# Patient Record
Sex: Female | Born: 1965 | Race: White | Hispanic: No | Marital: Married | Smoking: Never smoker
Health system: Southern US, Community
[De-identification: ages and names within clinical notes are randomized; demographics above are authoritative.]

## PROBLEM LIST (undated history)

## (undated) DIAGNOSIS — M549 Dorsalgia, unspecified: Secondary | ICD-10-CM

## (undated) DIAGNOSIS — R5383 Other fatigue: Secondary | ICD-10-CM

## (undated) DIAGNOSIS — N979 Female infertility, unspecified: Secondary | ICD-10-CM

## (undated) DIAGNOSIS — E039 Hypothyroidism, unspecified: Secondary | ICD-10-CM

## (undated) HISTORY — DX: Other fatigue: R53.83

## (undated) HISTORY — DX: Female infertility, unspecified: N97.9

## (undated) HISTORY — DX: Dorsalgia, unspecified: M54.9

## (undated) HISTORY — DX: Hypothyroidism, unspecified: E03.9

---

## 2004-08-17 ENCOUNTER — Other Ambulatory Visit: Admission: RE | Admit: 2004-08-17 | Discharge: 2004-08-17 | Payer: Self-pay | Admitting: Obstetrics and Gynecology

## 2010-11-17 ENCOUNTER — Encounter: Admission: RE | Admit: 2010-11-17 | Discharge: 2010-11-17 | Payer: Self-pay | Admitting: Family Medicine

## 2012-10-24 ENCOUNTER — Other Ambulatory Visit: Payer: Self-pay | Admitting: Family Medicine

## 2012-10-24 DIAGNOSIS — Z1231 Encounter for screening mammogram for malignant neoplasm of breast: Secondary | ICD-10-CM

## 2012-11-23 ENCOUNTER — Ambulatory Visit
Admission: RE | Admit: 2012-11-23 | Discharge: 2012-11-23 | Disposition: A | Discharge status: Home / Self Care | Payer: PRIVATE HEALTH INSURANCE | Source: Ambulatory Visit | Attending: Family Medicine | Admitting: Family Medicine

## 2012-11-23 DIAGNOSIS — Z1231 Encounter for screening mammogram for malignant neoplasm of breast: Secondary | ICD-10-CM

## 2013-11-19 ENCOUNTER — Other Ambulatory Visit: Payer: Self-pay

## 2013-11-19 DIAGNOSIS — Z1231 Encounter for screening mammogram for malignant neoplasm of breast: Secondary | ICD-10-CM

## 2013-12-28 ENCOUNTER — Ambulatory Visit
Admission: RE | Admit: 2013-12-28 | Discharge: 2013-12-28 | Disposition: A | Discharge status: Home / Self Care | Payer: Managed Care, Other (non HMO) | Source: Ambulatory Visit

## 2013-12-28 DIAGNOSIS — Z1231 Encounter for screening mammogram for malignant neoplasm of breast: Secondary | ICD-10-CM

## 2015-02-18 ENCOUNTER — Other Ambulatory Visit: Payer: Self-pay

## 2015-02-18 DIAGNOSIS — Z1231 Encounter for screening mammogram for malignant neoplasm of breast: Secondary | ICD-10-CM

## 2015-02-26 ENCOUNTER — Ambulatory Visit: Payer: Managed Care, Other (non HMO)

## 2015-03-12 ENCOUNTER — Ambulatory Visit
Admission: RE | Admit: 2015-03-12 | Discharge: 2015-03-12 | Disposition: A | Discharge status: Home / Self Care | Payer: 59 | Source: Ambulatory Visit

## 2015-03-12 DIAGNOSIS — Z1231 Encounter for screening mammogram for malignant neoplasm of breast: Secondary | ICD-10-CM

## 2016-04-29 ENCOUNTER — Other Ambulatory Visit: Payer: Self-pay

## 2016-04-29 DIAGNOSIS — Z1231 Encounter for screening mammogram for malignant neoplasm of breast: Secondary | ICD-10-CM

## 2016-05-13 ENCOUNTER — Ambulatory Visit
Admission: RE | Admit: 2016-05-13 | Discharge: 2016-05-13 | Disposition: A | Discharge status: Home / Self Care | Payer: 59 | Source: Ambulatory Visit

## 2016-05-13 DIAGNOSIS — Z1231 Encounter for screening mammogram for malignant neoplasm of breast: Secondary | ICD-10-CM

## 2017-09-15 ENCOUNTER — Other Ambulatory Visit: Payer: Self-pay | Admitting: Family Medicine

## 2017-09-15 DIAGNOSIS — Z1231 Encounter for screening mammogram for malignant neoplasm of breast: Secondary | ICD-10-CM

## 2017-09-28 ENCOUNTER — Ambulatory Visit: Payer: 59

## 2017-10-10 ENCOUNTER — Ambulatory Visit
Admission: RE | Admit: 2017-10-10 | Discharge: 2017-10-10 | Disposition: A | Discharge status: Home / Self Care | Payer: BC Managed Care – PPO | Source: Ambulatory Visit | Attending: Family Medicine | Admitting: Family Medicine

## 2017-10-10 DIAGNOSIS — Z1231 Encounter for screening mammogram for malignant neoplasm of breast: Secondary | ICD-10-CM

## 2017-12-08 ENCOUNTER — Encounter (INDEPENDENT_AMBULATORY_CARE_PROVIDER_SITE_OTHER): Payer: Self-pay

## 2017-12-21 ENCOUNTER — Encounter (INDEPENDENT_AMBULATORY_CARE_PROVIDER_SITE_OTHER): Payer: Self-pay | Admitting: Family Medicine

## 2017-12-21 ENCOUNTER — Ambulatory Visit (INDEPENDENT_AMBULATORY_CARE_PROVIDER_SITE_OTHER): Payer: BC Managed Care – PPO | Admitting: Family Medicine

## 2017-12-21 VITALS — BP 134/90 | HR 71 | Temp 97.6°F | Ht 69.0 in | Wt 273.0 lb

## 2017-12-21 DIAGNOSIS — E66813 Obesity, class 3: Secondary | ICD-10-CM

## 2017-12-21 DIAGNOSIS — Z1331 Encounter for screening for depression: Secondary | ICD-10-CM | POA: Diagnosis not present

## 2017-12-21 DIAGNOSIS — E063 Autoimmune thyroiditis: Secondary | ICD-10-CM

## 2017-12-21 DIAGNOSIS — R5383 Other fatigue: Secondary | ICD-10-CM

## 2017-12-21 DIAGNOSIS — Z0289 Encounter for other administrative examinations: Secondary | ICD-10-CM

## 2017-12-21 DIAGNOSIS — Z6841 Body Mass Index (BMI) 40.0 and over, adult: Secondary | ICD-10-CM

## 2017-12-21 DIAGNOSIS — R0602 Shortness of breath: Secondary | ICD-10-CM | POA: Diagnosis not present

## 2017-12-21 NOTE — Progress Notes (Signed)
.  Office: [phone]  /  Fax: [phone]   HPI:   Chief Complaint: OBESITY  Christy [name] (MR# [id number]) is a 52 y.o. female who presents on 12/21/2017 for obesity evaluation and treatment. Current BMI is Body mass index is 40.32 kg/m.Marland Kitchen Christy [name] has struggled with obesity for years and has been unsuccessful in either losing weight or maintaining long term weight loss. Christy [name] attended our information session and states she is currently in the action stage of change and ready to dedicate time achieving and maintaining a healthier weight. Christy [name] notes emotional eating and she is tearful in the office while discussing her weight.   Christy [name] states her family eats meals together she thinks her family will eat healthier with  her she struggles with family and or coworkers weight loss sabotage her desired weight loss is 53-78 lbs she has been heavy most of  her life she started gaining weight the last several years her heaviest weight ever was 273 lbs. she has significant food cravings issues  she snacks frequently in the evenings she skips meals frequently she is frequently drinking liquids with calories she frequently makes poor food choices she has problems with excessive hunger  she frequently eats larger portions than normal  she has binge eating behaviors she struggles with emotional eating    Christy [name] feels her energy is lower than it should be. This has worsened with weight gain and has not worsened recently. Christy [name] admits to daytime somnolence and admits she doesn't sleep well and wakes up unrefreshed. Patient is at risk for obstructive sleep apnea. Patent has a history of symptoms of daytime Christy. Patient generally gets 6 hours of sleep per night, and states they generally have nightime awakenings. Snoring is present. Apneic episodes are present. Epworth Sleepiness Score is 16.  Dyspnea on exertion Brinlyn notes increasing shortness of breath with exercising and seems to be  worsening over time with weight gain. She notes getting out of breath sooner with activity than she used to. This has not gotten worse recently. Christy [name] denies orthopnea.  Hashimoto's Hypothyroidism Madhavi has a diagnosis of hashimoto's hypothyroidism. She is on Synthroid and she has no recent labs in [facility]. She notes Christy denies hot or cold intolerance or palpitations.  Depression Screen Christy [name]'s Food and Mood (modified PHQ-9) score was  Depression screen PHQ 2/9 12/21/2017  Decreased Interest 1  Down, Depressed, Hopeless 1  PHQ - 2 Score 2  Altered sleeping 1  Tired, decreased energy 3  Change in appetite 2  Feeling bad or failure about yourself  2  Trouble concentrating 1  Moving slowly or fidgety/restless 1  Suicidal thoughts 0  PHQ-9 Score 12  Difficult doing work/chores Not difficult at all   ALLERGIES: Not on File  MEDICATIONS: Current Outpatient Medications on File Prior to Visit  Medication Sig Dispense Refill  . levothyroxine (SYNTHROID, LEVOTHROID) 150 MCG tablet Take 150 mcg by mouth daily before breakfast.     No current facility-administered medications on file prior to visit.     PAST MEDICAL HISTORY: Past Medical History:  Diagnosis Date  . Back pain   . Christy   . Hypothyroidism   . Infertility, female     PAST SURGICAL HISTORY: No past surgical history on file.  SOCIAL HISTORY: Social History   Tobacco Use  . Smoking status: Not on file  Substance Use Topics  . Alcohol use: No    Frequency: Never  . Drug use: No    FAMILY HISTORY: Family  History  Problem Relation Age of Onset  . Diabetes Mother   . Obesity Mother   . Cancer Father     ROS: Review of Systems  Constitutional: Positive for malaise/Christy. Negative for weight loss.       Negative hot/cold intolerance  HENT: Positive for hearing loss (decreased hearing).   Eyes:       Wears glasses or contacts (distance)  Respiratory: Positive for shortness of breath (with exertion).     Cardiovascular: Negative for palpitations and orthopnea.  Musculoskeletal: Positive for back pain.  Skin:       Dryness   Endo/Heme/Allergies:       Excessive hunger  Psychiatric/Behavioral: Positive for depression. Negative for suicidal ideas.       Stress     PHYSICAL EXAM: Blood pressure 134/90, pulse 71, temperature 97.6 F (36.4 C), temperature source Oral, height 5\' 9"  (1.753 m), weight 273 lb (123.8 kg), last menstrual period 10/04/2017, SpO2 98 %. Body mass index is 40.32 kg/m. Physical Exam  Constitutional: She is oriented to person, place, and time. She appears well-developed and well-nourished.  Cardiovascular: Normal rate.  Pulmonary/Chest: Effort normal.  Musculoskeletal: Normal range of motion.  Neurological: She is oriented to person, place, and time.  Skin: Skin is warm and dry.  Psychiatric: She has a normal mood and affect.  Vitals reviewed.   RECENT LABS AND TESTS: BMET No results found for: NA, K, CL, CO2, GLUCOSE, BUN, CREATININE, CALCIUM, GFRNONAA, GFRAA No results found for: HGBA1C No results found for: INSULIN CBC No results found for: WBC, RBC, HGB, HCT, PLT, MCV, MCH, MCHC, RDW, LYMPHSABS, MONOABS, EOSABS, BASOSABS Iron/TIBC/Ferritin/ %Sat No results found for: IRON, TIBC, FERRITIN, IRONPCTSAT Lipid Panel  No results found for: CHOL, TRIG, HDL, CHOLHDL, VLDL, LDLCALC, LDLDIRECT Hepatic Function Panel  No results found for: PROT, ALBUMIN, AST, ALT, ALKPHOS, BILITOT, BILIDIR, IBILI No results found for: TSH  ECG  shows NSR with a rate of 73 BPM INDIRECT CALORIMETER done today shows a VO2 of 225 and a REE of 1566. Her calculated basal metabolic rate is 4098 thus her basal metabolic rate is worse than expected.    ASSESSMENT AND PLAN: Other Christy - Plan: EKG 12-Lead, Comprehensive metabolic panel, CBC With Differential, Hemoglobin A1c, Insulin, random, Lipid Panel With LDL/HDL Ratio, VITAMIN D 25 Hydroxy (Vit-D Deficiency, Fractures), Vitamin  B12, Folate  Shortness of breath on exertion - Plan: CBC With Differential, Hemoglobin A1c, Insulin, random, Lipid Panel With LDL/HDL Ratio  Hashimoto's disease - Plan: T3, T4, free, TSH  Depression screening  Class 3 severe obesity with serious comorbidity and body mass index (BMI) of 40.0 to 44.9 in adult, unspecified obesity type (HCC)  PLAN:  Christy Christy [name] was informed that her Christy may be related to obesity, depression or many other causes. Labs will be ordered, and in the meanwhile Christy [name] has agreed to work on diet, exercise and weight loss to help with Christy. Proper sleep hygiene was discussed including the need for 7-8 hours of quality sleep each night. A sleep study was not ordered based on symptoms and Epworth score. Meyah may need a sleep study if Christy doesn't improve with weight loss.  Dyspnea on exertion Christy [name]'s shortness of breath appears to be obesity related and exercise induced. She has agreed to work on weight loss and gradually increase exercise to treat her exercise induced shortness of breath. If Christy [name] follows our instructions and loses weight without improvement of her shortness of breath, we will plan to refer  to pulmonology. We will monitor this condition regularly. Christy [name] agrees to this plan.  Hashimoto's Hypothyroidism Christy [name] was informed of the importance of good thyroid control to help with weight loss efforts. She was also informed that supertheraputic thyroid levels are dangerous and will not improve weight loss results. We will check labs and Christy [name] will continue taking Synthroid as prescribed.   Depression Screen Christy [name] had a moderately positive depression screening. Depression is commonly associated with obesity and often results in emotional eating behaviors. We will monitor this closely and work on CBT to help improve the non-hunger eating patterns. Referral to Psychology may be required if no improvement is seen as she continues in our  clinic.  Obesity Christy [name] is currently in the action stage of change and her goal is to continue with weight loss efforts She has agreed to follow the Category 2 plan + 100 calories Christy [name] has been instructed to work up to a goal of 150 minutes of combined cardio and strengthening exercise per week for weight loss and overall health benefits. We discussed the following Behavioral Modification Strategies today: increasing lean protein intake, decrease eating out, work on meal planning and easy cooking plans and dealing with family or coworker sabotage  Christy [name] has agreed to follow up with our clinic in 2 weeks. She was informed of the importance of frequent follow up visits to maximize her success with intensive lifestyle modifications for her multiple health conditions. She was informed we would discuss her lab results at her next visit unless there is a critical issue that needs to be addressed sooner. Christy [name] agreed to keep her next visit at the agreed upon time to discuss these results.  Trude McburneyI, Sharon Martin, am acting as transcriptionist for Quillian Quincearen Kayshawn Ozburn, MD   Today's visit was # 1 out of 22.  Starting weight: 273 lbs Starting date: 12/21/17 Today's weight : 273 lbs  Today's date: 12/21/2017 Total lbs lost to date: 0 (Patients must lose 7 lbs in the first 6 months to continue with counseling)   ASK: We discussed the diagnosis of obesity with Melburn HakeLaura Klee today and Christy [name] agreed to give us permission to discuss obesity behavioral modification therapy today.  ASSESS: Christy [name] has the diagnosis of obesity and her BMI today is 40.3 Christy [name] is in the action stage of change   ADVISE: Christy [name] was educated on the multiple health risks of obesity as well as the benefit of weight loss to improve her health. She was advised of the need for long term treatment and the importance of lifestyle modifications.  AGREE: Multiple dietary modification options and treatment options were discussed and  Christy [name] agreed to  follow the Category 2 plan + 100 calories We discussed the following Behavioral Modification Strategies today: increasing lean protein intake, decrease eating out, work on meal planning and easy cooking plans and dealing with family or coworker sabotage  I have reviewed the above documentation for accuracy and completeness, and I agree with the above. -Quillian Quincearen Lavenia Stumpo, MD

## 2017-12-22 LAB — COMPREHENSIVE METABOLIC PANEL
A/G RATIO: 2 (ref 1.2–2.2)
ALBUMIN: 4.3 g/dL (ref 3.5–5.5)
ALT: 14 IU/L (ref 0–32)
AST: 25 IU/L (ref 0–40)
Alkaline Phosphatase: 80 IU/L (ref 39–117)
BUN / CREAT RATIO: 20 (ref 9–23)
BUN: 14 mg/dL (ref 6–24)
Bilirubin Total: 0.3 mg/dL (ref 0.0–1.2)
CALCIUM: 9 mg/dL (ref 8.7–10.2)
CO2: 20 mmol/L (ref 20–29)
Chloride: 102 mmol/L (ref 96–106)
Creatinine, Ser: 0.71 mg/dL (ref 0.57–1.00)
GFR, EST AFRICAN AMERICAN: 113 mL/min/{1.73_m2} (ref >59)
GFR, EST NON AFRICAN AMERICAN: 98 mL/min/{1.73_m2} (ref >59)
GLOBULIN, TOTAL: 2.2 g/dL (ref 1.5–4.5)
Glucose: 97 mg/dL (ref 65–99)
POTASSIUM: 4.6 mmol/L (ref 3.5–5.2)
SODIUM: 138 mmol/L (ref 134–144)
TOTAL PROTEIN: 6.5 g/dL (ref 6.0–8.5)

## 2017-12-22 LAB — CBC WITH DIFFERENTIAL
BASOS: 1 %
Basophils Absolute: 0 10*3/uL (ref 0.0–0.2)
EOS (ABSOLUTE): 0.3 10*3/uL (ref 0.0–0.4)
Eos: 5 %
Hematocrit: 39.8 % (ref 34.0–46.6)
Hemoglobin: 12.9 g/dL (ref 11.1–15.9)
Immature Grans (Abs): 0 10*3/uL (ref 0.0–0.1)
Immature Granulocytes: 0 %
LYMPHS ABS: 1.9 10*3/uL (ref 0.7–3.1)
Lymphs: 30 %
MCH: 29.3 pg (ref 26.6–33.0)
MCHC: 32.4 g/dL (ref 31.5–35.7)
MCV: 90 fL (ref 79–97)
Monocytes Absolute: 0.4 10*3/uL (ref 0.1–0.9)
Monocytes: 7 %
NEUTROS ABS: 3.8 10*3/uL (ref 1.4–7.0)
NEUTROS PCT: 57 %
RBC: 4.41 x10E6/uL (ref 3.77–5.28)
RDW: 14.7 % (ref 12.3–15.4)
WBC: 6.5 10*3/uL (ref 3.4–10.8)

## 2017-12-22 LAB — LIPID PANEL WITH LDL/HDL RATIO
Cholesterol, Total: 252 mg/dL — ABNORMAL HIGH (ref 100–199)
HDL: 47 mg/dL (ref >39)
LDL CALC: 173 mg/dL — AB (ref 0–99)
LDL/HDL RATIO: 3.7 ratio — AB (ref 0.0–3.2)
TRIGLYCERIDES: 159 mg/dL — AB (ref 0–149)
VLDL CHOLESTEROL CAL: 32 mg/dL (ref 5–40)

## 2017-12-22 LAB — T4, FREE: Free T4: 1.62 ng/dL (ref 0.82–1.77)

## 2017-12-22 LAB — FOLATE: FOLATE: 5 ng/mL (ref >3.0)

## 2017-12-22 LAB — VITAMIN D 25 HYDROXY (VIT D DEFICIENCY, FRACTURES): Vit D, 25-Hydroxy: 18.6 ng/mL — ABNORMAL LOW (ref 30.0–100.0)

## 2017-12-22 LAB — TSH: TSH: 0.981 u[IU]/mL (ref 0.450–4.500)

## 2017-12-22 LAB — HEMOGLOBIN A1C
Est. average glucose Bld gHb Est-mCnc: 117 mg/dL
Hgb A1c MFr Bld: 5.7 % — ABNORMAL HIGH (ref 4.8–5.6)

## 2017-12-22 LAB — T3: T3, Total: 94 ng/dL (ref 71–180)

## 2017-12-22 LAB — VITAMIN B12: Vitamin B-12: 514 pg/mL (ref 232–1245)

## 2017-12-22 LAB — INSULIN, RANDOM: INSULIN: 14.4 u[IU]/mL (ref 2.6–24.9)

## 2018-01-04 ENCOUNTER — Ambulatory Visit (INDEPENDENT_AMBULATORY_CARE_PROVIDER_SITE_OTHER): Payer: BC Managed Care – PPO | Admitting: Family Medicine

## 2018-01-04 VITALS — BP 127/86 | HR 77 | Temp 98.0°F | Ht 69.0 in | Wt 265.0 lb

## 2018-01-04 DIAGNOSIS — R7303 Prediabetes: Secondary | ICD-10-CM | POA: Insufficient documentation

## 2018-01-04 DIAGNOSIS — Z6839 Body mass index (BMI) 39.0-39.9, adult: Secondary | ICD-10-CM

## 2018-01-04 DIAGNOSIS — E559 Vitamin D deficiency, unspecified: Secondary | ICD-10-CM | POA: Diagnosis not present

## 2018-01-04 DIAGNOSIS — E782 Mixed hyperlipidemia: Secondary | ICD-10-CM

## 2018-01-04 DIAGNOSIS — Z9189 Other specified personal risk factors, not elsewhere classified: Secondary | ICD-10-CM

## 2018-01-04 MED ORDER — VITAMIN D (ERGOCALCIFEROL) 1.25 MG (50000 UNIT) PO CAPS: 50000.0000 [IU] | ORAL_CAPSULE | ORAL | 0 refills | Status: DC

## 2018-01-04 MED ORDER — METFORMIN HCL 500 MG PO TABS: 500.0000 mg | ORAL_TABLET | Freq: Every day | ORAL | 0 refills | Status: DC

## 2018-01-04 NOTE — Progress Notes (Signed)
Office: [phone]  /  Fax: [phone]   HPI:   Chief Complaint: OBESITY Christy [name] is here to discuss her progress with her obesity treatment plan. She is on the Category 2 plan +100 calories and is following her eating plan approximately 90 % of the time. She states she is exercising 0 minutes 0 times per week. Christy [name] did very well with weight loss over the last 2 weeks. She had some family sabotage, especially for dinner. She missed eating potatoes and pasta. Hunger was controlled. Her weight is 265 lb (120.2 kg) today and has had a weight loss of 8 pounds over a period of 2 weeks since her last visit. She has lost 8 lbs since starting treatment with Christy [name].  Mixed Hyperlipidemia [name] has a new diagnosis of mixed hyperlipidemia and is not on statin. She would like to attempt to improve her cholesterol levels with intensive lifestyle modification including a low saturated fat diet, exercise and weight loss. She denies any chest pain, claudication or myalgias.  Vitamin D deficiency Christy [name] has a new diagnosis of vitamin D deficiency. She is not currently taking vit D and admits fatigue but denies nausea, vomiting or muscle weakness.   Ref. Range 12/21/2017 10:57  Vitamin D, 25-Hydroxy Latest Ref Range: 30.0 - 100.0 ng/mL 18.6 (L)   Pre-Diabetes Christy [name] has a diagnosis of pre-diabetes based on her elevated Hgb A1c and fasting insulin. She was informed this puts her at greater risk of developing diabetes. She is not taking metformin currently and continues to work on diet and exercise to decrease risk of diabetes. She admits polyphagia, but this improved on diet prescription. [name] denies nausea or hypoglycemia.   ALLERGIES: Not on File  MEDICATIONS: Current Outpatient Medications on File Prior to Visit  Medication Sig Dispense Refill  . levothyroxine (SYNTHROID, LEVOTHROID) 150 MCG tablet Take 150 mcg by mouth daily before breakfast.     No current facility-administered medications on file  prior to visit.     PAST MEDICAL HISTORY: Past Medical History:  Diagnosis Date  . Back pain   . Fatigue   . Hypothyroidism   . Infertility, female     PAST SURGICAL HISTORY: No past surgical history on file.  SOCIAL HISTORY: Social History   Tobacco Use  . Smoking status: Not on file  Substance Use Topics  . Alcohol use: No    Frequency: Never  . Drug use: No    FAMILY HISTORY: Family History  Problem Relation Age of Onset  . Diabetes Mother   . Obesity Mother   . Cancer Father     ROS: Review of Systems  Constitutional: Positive for malaise/fatigue and weight loss.  Cardiovascular: Negative for chest pain and claudication.  Gastrointestinal: Negative for nausea and vomiting.  Musculoskeletal: Negative for myalgias.       Negative muscle weakness  Endo/Heme/Allergies:       Positive polyphagia Negative hypoglycemia    PHYSICAL EXAM: Blood pressure 127/86, pulse 77, temperature 98 F (36.7 C), temperature source Oral, height 5\' 9"  (1.753 m), weight 265 lb (120.2 kg), SpO2 96 %. Body mass index is 39.13 kg/m. Physical Exam  Constitutional: She is oriented to person, place, and time. She appears well-developed and well-nourished.  Cardiovascular: Normal rate.  Pulmonary/Chest: Effort normal.  Musculoskeletal: Normal range of motion.  Neurological: She is oriented to person, place, and time.  Skin: Skin is warm and dry.  Psychiatric: She has a normal mood and affect. Her behavior is normal.  Vitals reviewed.  RECENT LABS AND TESTS: BMET    Component Value Date/Time   NA 138 12/21/2017 1057   K 4.6 12/21/2017 1057   CL 102 12/21/2017 1057   CO2 20 12/21/2017 1057   GLUCOSE 97 12/21/2017 1057   BUN 14 12/21/2017 1057   CREATININE 0.71 12/21/2017 1057   CALCIUM 9.0 12/21/2017 1057   GFRNONAA 98 12/21/2017 1057   GFRAA 113 12/21/2017 1057   Lab Results  Component Value Date   HGBA1C 5.7 (H) 12/21/2017   Lab Results  Component Value Date    INSULIN 14.4 12/21/2017   CBC    Component Value Date/Time   WBC 6.5 12/21/2017 1057   RBC 4.41 12/21/2017 1057   HGB 12.9 12/21/2017 1057   HCT 39.8 12/21/2017 1057   MCV 90 12/21/2017 1057   MCH 29.3 12/21/2017 1057   MCHC 32.4 12/21/2017 1057   RDW 14.7 12/21/2017 1057   LYMPHSABS 1.9 12/21/2017 1057   EOSABS 0.3 12/21/2017 1057   BASOSABS 0.0 12/21/2017 1057   Iron/TIBC/Ferritin/ %Sat No results found for: IRON, TIBC, FERRITIN, IRONPCTSAT Lipid Panel     Component Value Date/Time   CHOL 252 (H) 12/21/2017 1057   TRIG 159 (H) 12/21/2017 1057   HDL 47 12/21/2017 1057   LDLCALC 173 (H) 12/21/2017 1057   Hepatic Function Panel     Component Value Date/Time   PROT 6.5 12/21/2017 1057   ALBUMIN 4.3 12/21/2017 1057   AST 25 12/21/2017 1057   ALT 14 12/21/2017 1057   ALKPHOS 80 12/21/2017 1057   BILITOT 0.3 12/21/2017 1057      Component Value Date/Time   TSH 0.981 12/21/2017 1057     Ref. Range 12/21/2017 10:57  Vitamin D, 25-Hydroxy Latest Ref Range: 30.0 - 100.0 ng/mL 18.6 (L)    ASSESSMENT AND PLAN: Mixed hyperlipidemia  Vitamin D deficiency - Plan: Vitamin D, Ergocalciferol, (DRISDOL) 50000 units CAPS capsule  Prediabetes - Plan: metFORMIN (GLUCOPHAGE) 500 MG tablet  At risk for diabetes mellitus  Class 2 severe obesity with serious comorbidity and body mass index (BMI) of 39.0 to 39.9 in adult, unspecified obesity type (HCC)  PLAN:  Mixed Hyperlipidemia Christy [name] was informed of the American Heart Association Guidelines emphasizing intensive lifestyle modifications as the first line treatment for hyperlipidemia. We discussed many lifestyle modifications today in depth, and Christy [name] will continue to work on decreasing saturated fats such as fatty red meat, butter and many fried foods. She will also increase vegetables and lean protein in her diet and continue to work on exercise and weight loss efforts. We will recheck labs in 3 months and Christy [name] agrees to follow up  at the agreed upon time.  Vitamin D Deficiency Christy [name] was informed that low vitamin D levels contributes to fatigue and are associated with obesity, breast, and colon cancer. She agrees to start to take prescription Vit D @50 ,000 IU every week #4 with no refills and will follow up for routine testing of vitamin D, at least 2-3 times per year. She was informed of the risk of over-replacement of vitamin D and agrees to not increase her dose unless she discusses this with us first. Christy [name] agrees to follow up with our clinic in 2 weeks.  Pre-Diabetes Christy [name] will continue to work on weight loss, exercise, and decreasing simple carbohydrates in her diet to help decrease the risk of diabetes. We dicussed metformin including benefits and risks. She was informed that eating too many simple carbohydrates or too many calories at one sitting increases  the likelihood of GI side effects. Mesa agrees to start metformin 500 mg qam #30 with no refills. We will recheck labs in 3 months and  Karon agreed to follow up with Christy [name] as directed to monitor her progress.  Obesity Megen is currently in the action stage of change. As such, her goal is to continue with weight loss efforts She has agreed to follow the Category 2 plan Makensey has been instructed to work up to a goal of 150 minutes of combined cardio and strengthening exercise per week for weight loss and overall health benefits. We discussed the following Behavioral Modification Strategies today: increasing lean protein intake, decreasing simple carbohydrates , work on meal planning and easy cooking plans and dealing with family or coworker sabotage  Lakiya has agreed to follow up with our clinic in 2 weeks. She was informed of the importance of frequent follow up visits to maximize her success with intensive lifestyle modifications for her multiple health conditions.   OBESITY BEHAVIORAL INTERVENTION VISIT  Today's visit was # 2 out of 22.  Starting weight: 273  lbs Starting date: 12/21/17 Today's weight : 265 lbs Today's date: 01/04/2018 Total lbs lost to date: 8 (Patients must lose 7 lbs in the first 6 months to continue with counseling)   ASK: We discussed the diagnosis of obesity with Melburn Hake today and Lelar agreed to give Christy [name] permission to discuss obesity behavioral modification therapy today.  ASSESS: Kinzy has the diagnosis of obesity and her BMI today is 39.12 Tami is in the action stage of change   ADVISE: Mackenzi was educated on the multiple health risks of obesity as well as the benefit of weight loss to improve her health. She was advised of the need for long term treatment and the importance of lifestyle modifications.  AGREE: Multiple dietary modification options and treatment options were discussed and  Xochilt agreed to the above obesity treatment plan.  I, [place] Crane, am acting as transcriptionist for Quillian Quince, MD  I have reviewed the above documentation for accuracy and completeness, and I agree with the above. -Quillian Quince, MD

## 2018-01-18 ENCOUNTER — Ambulatory Visit (INDEPENDENT_AMBULATORY_CARE_PROVIDER_SITE_OTHER): Payer: BC Managed Care – PPO | Admitting: Family Medicine

## 2018-01-18 VITALS — BP 127/77 | HR 74 | Temp 97.7°F | Ht 69.0 in | Wt 261.0 lb

## 2018-01-18 DIAGNOSIS — Z6838 Body mass index (BMI) 38.0-38.9, adult: Secondary | ICD-10-CM | POA: Diagnosis not present

## 2018-01-18 DIAGNOSIS — R7303 Prediabetes: Secondary | ICD-10-CM | POA: Diagnosis not present

## 2018-01-18 NOTE — Progress Notes (Signed)
Office: [phone]  /  Fax: [phone]   HPI:   Chief Complaint: OBESITY Christy [name] is here to discuss her progress with her obesity treatment plan. She is on the Category 2 plan and is following her eating plan approximately 80 % of the time. She states she is exercising 0 minutes 0 times per week. Christy [name] continues to do well with weight loss on the category 2 plan. She had some increased celebration eating and eating out, but she tried to make good choices. Her weight is 261 lb (118.4 kg) today and has had a weight loss of 4 pounds over a period of 2 weeks since her last visit. She has lost 12 lbs since starting treatment with us.  Pre-Diabetes Christy [name] has a diagnosis of pre-diabetes based on her elevated Hgb A1c and was informed this puts her at greater risk of developing diabetes. She started taking metformin and noted mildly loose stools, but this has improved. She is doing well on her diet prescription and with weight loss. Christy [name] continues to work on diet and exercise to decrease risk of diabetes. She denies nausea or hypoglycemia.  ALLERGIES: Not on File  MEDICATIONS: Current Outpatient Medications on File Prior to Visit  Medication Sig Dispense Refill  . levothyroxine (SYNTHROID, LEVOTHROID) 150 MCG tablet Take 150 mcg by mouth daily before breakfast.    . metFORMIN (GLUCOPHAGE) 500 MG tablet Take 1 tablet (500 mg total) by mouth daily with breakfast. 30 tablet 0  . Vitamin D, Ergocalciferol, (DRISDOL) 50000 units CAPS capsule Take 1 capsule (50,000 Units total) by mouth every 7 (seven) days. 4 capsule 0   No current facility-administered medications on file prior to visit.     PAST MEDICAL HISTORY: Past Medical History:  Diagnosis Date  . Back pain   . Fatigue   . Hypothyroidism   . Infertility, female     PAST SURGICAL HISTORY: No past surgical history on file.  SOCIAL HISTORY: Social History   Tobacco Use  . Smoking status: Not on file  Substance Use Topics  .  Alcohol use: No    Frequency: Never  . Drug use: No    FAMILY HISTORY: Family History  Problem Relation Age of Onset  . Diabetes Mother   . Obesity Mother   . Cancer Father     ROS: Review of Systems  Constitutional: Positive for weight loss.  Gastrointestinal: Positive for diarrhea. Negative for nausea.  Endo/Heme/Allergies:       Negative hypoglycemia    PHYSICAL EXAM: Blood pressure 127/77, pulse 74, temperature 97.7 F (36.5 C), temperature source Oral, height 5\' 9"  (1.753 m), weight 261 lb (118.4 kg), SpO2 97 %. Body mass index is 38.54 kg/m. Physical Exam  Constitutional: She is oriented to person, place, and time. She appears well-developed and well-nourished.  Cardiovascular: Normal rate.  Pulmonary/Chest: Effort normal.  Musculoskeletal: Normal range of motion.  Neurological: She is oriented to person, place, and time.  Skin: Skin is warm and dry.  Psychiatric: She has a normal mood and affect. Her behavior is normal.  Vitals reviewed.   RECENT LABS AND TESTS: BMET    Component Value Date/Time   NA 138 12/21/2017 1057   K 4.6 12/21/2017 1057   CL 102 12/21/2017 1057   CO2 20 12/21/2017 1057   GLUCOSE 97 12/21/2017 1057   BUN 14 12/21/2017 1057   CREATININE 0.71 12/21/2017 1057   CALCIUM 9.0 12/21/2017 1057   GFRNONAA 98 12/21/2017 1057   GFRAA 113 12/21/2017 1057  Lab Results  Component Value Date   HGBA1C 5.7 (H) 12/21/2017   Lab Results  Component Value Date   INSULIN 14.4 12/21/2017   CBC    Component Value Date/Time   WBC 6.5 12/21/2017 1057   RBC 4.41 12/21/2017 1057   HGB 12.9 12/21/2017 1057   HCT 39.8 12/21/2017 1057   MCV 90 12/21/2017 1057   MCH 29.3 12/21/2017 1057   MCHC 32.4 12/21/2017 1057   RDW 14.7 12/21/2017 1057   LYMPHSABS 1.9 12/21/2017 1057   EOSABS 0.3 12/21/2017 1057   BASOSABS 0.0 12/21/2017 1057   Iron/TIBC/Ferritin/ %Sat No results found for: IRON, TIBC, FERRITIN, IRONPCTSAT Lipid Panel     Component  Value Date/Time   CHOL 252 (H) 12/21/2017 1057   TRIG 159 (H) 12/21/2017 1057   HDL 47 12/21/2017 1057   LDLCALC 173 (H) 12/21/2017 1057   Hepatic Function Panel     Component Value Date/Time   PROT 6.5 12/21/2017 1057   ALBUMIN 4.3 12/21/2017 1057   AST 25 12/21/2017 1057   ALT 14 12/21/2017 1057   ALKPHOS 80 12/21/2017 1057   BILITOT 0.3 12/21/2017 1057      Component Value Date/Time   TSH 0.981 12/21/2017 1057    ASSESSMENT AND PLAN: Prediabetes  Class 2 severe obesity with serious comorbidity and body mass index (BMI) of 38.0 to 38.9 in adult, unspecified obesity type (HCC)  PLAN:  Pre-Diabetes Christy [name] will continue to work on weight loss, exercise, and decreasing simple carbohydrates in her diet to help decrease the risk of diabetes. We dicussed metformin including benefits and risks. She was informed that eating too many simple carbohydrates or too many calories at one sitting increases the likelihood of GI side effects. Cing agrees to continue metformin (no refill needed) and follow up with [name] as directed to monitor her progress.  We spent > than 50% of the 15 minute visit on the counseling as documented in the note.  Obesity Christy [name] is currently in the action stage of change. As such, her goal is to continue with weight loss efforts She has agreed to follow the Category 2 plan Christy [name] has been instructed to work up to a goal of 150 minutes of combined cardio and strengthening exercise per week for weight loss and overall health benefits. We discussed the following Behavioral Modification Strategies today: increasing lean protein intake, decreasing simple carbohydrates and celebration eating strategies   Christy [name] has agreed to follow up with our clinic in 2 weeks. She was informed of the importance of frequent follow up visits to maximize her success with intensive lifestyle modifications for her multiple health conditions.   OBESITY BEHAVIORAL INTERVENTION VISIT  Today's  visit was # 3 out of 22.  Starting weight: 273 lbs Starting date: 12/21/17 Today's weight : 261 lbs Today's date: 01/18/2018 Total lbs lost to date: 12 (Patients must lose 7 lbs in the first 6 months to continue with counseling)   ASK: We discussed the diagnosis of obesity with Christy [name] today and Christy [name] agreed to give [name] permission to discuss obesity behavioral modification therapy today.  ASSESS: Christy [name] has the diagnosis of obesity and her BMI today is 38.53 Christy [name] is in the action stage of change   ADVISE: Jonell was educated on the multiple health risks of obesity as well as the benefit of weight loss to improve her health. She was advised of the need for long term treatment and the importance of lifestyle modifications.  AGREE: Multiple dietary modification options  and treatment options were discussed and  Aliene agreed to the above obesity treatment plan.  I, Nevada Crane, am acting as transcriptionist for Quillian Quince, MD  I have reviewed the above documentation for accuracy and completeness, and I agree with the above. -Quillian Quince, MD

## 2018-01-31 ENCOUNTER — Other Ambulatory Visit (INDEPENDENT_AMBULATORY_CARE_PROVIDER_SITE_OTHER): Payer: Self-pay | Admitting: Family Medicine

## 2018-01-31 DIAGNOSIS — R7303 Prediabetes: Secondary | ICD-10-CM

## 2018-02-02 ENCOUNTER — Ambulatory Visit (INDEPENDENT_AMBULATORY_CARE_PROVIDER_SITE_OTHER): Payer: BC Managed Care – PPO | Admitting: Family Medicine

## 2018-02-02 VITALS — BP 124/86 | HR 71 | Temp 97.9°F | Ht 69.0 in | Wt 259.0 lb

## 2018-02-02 DIAGNOSIS — E559 Vitamin D deficiency, unspecified: Secondary | ICD-10-CM | POA: Diagnosis not present

## 2018-02-02 DIAGNOSIS — R7303 Prediabetes: Secondary | ICD-10-CM | POA: Diagnosis not present

## 2018-02-02 DIAGNOSIS — Z6838 Body mass index (BMI) 38.0-38.9, adult: Secondary | ICD-10-CM | POA: Diagnosis not present

## 2018-02-02 DIAGNOSIS — Z9189 Other specified personal risk factors, not elsewhere classified: Secondary | ICD-10-CM | POA: Diagnosis not present

## 2018-02-02 MED ORDER — METFORMIN HCL 500 MG PO TABS: 500.0000 mg | ORAL_TABLET | Freq: Every day | ORAL | 0 refills | Status: DC

## 2018-02-02 MED ORDER — VITAMIN D (ERGOCALCIFEROL) 1.25 MG (50000 UNIT) PO CAPS: 50000.0000 [IU] | ORAL_CAPSULE | ORAL | 0 refills | Status: DC

## 2018-02-02 NOTE — Progress Notes (Signed)
Office: [phone]  /  Fax: [phone]   HPI:   Chief Complaint: OBESITY Christy [name] is here to discuss her progress with her obesity treatment plan at age 52. She is on the Category 2 plan and is following her eating plan approximately 75 % of the time. She states she is exercising 0 minutes 0 times per week. Christy [name] continues to do well with weight loss, but is getting bored with dinner, and would like more options. Her weight is 259 lb (117.5 kg) today and has had a weight loss of 2 pounds over a period of 2 weeks since her last visit. She has lost 14 lbs since starting treatment with us.  Vitamin D deficiency Christy [name] has a diagnosis of vitamin D deficiency. She is currently taking vit D and is not yet at goal. She noted nausea when taking with metformin, but this resolved when she took it at another time. She denies vomiting or muscle weakness.   Ref. Range 12/21/2017 10:57  Vitamin D, 25-Hydroxy Latest Ref Range: 30.0 - 100.0 ng/mL 18.6 (L)   Pre-Diabetes Christy [name] has a diagnosis of pre-diabetes based on her elevated Hgb A1c and was informed this puts her at greater risk of developing diabetes. She is stable on metformin and is doing well on her diet. She continues to work on diet and exercise to decrease risk of diabetes. She denies nausea, vomiting or hypoglycemia.  At risk for diabetes Christy [name] is at higher than average risk for developing diabetes due to her obesity and pre-diabetes. She currently denies polyuria or polydipsia.  ALLERGIES: No Known Allergies  MEDICATIONS: Current Outpatient Medications on File Prior to Visit  Medication Sig Dispense Refill  . levothyroxine (SYNTHROID, LEVOTHROID) 150 MCG tablet Take 150 mcg by mouth daily before breakfast.     No current facility-administered medications on file prior to visit.     PAST MEDICAL HISTORY: Past Medical History:  Diagnosis Date  . Back pain   . Fatigue   . Hypothyroidism   . Infertility, female     PAST SURGICAL  HISTORY: No past surgical history on file.  SOCIAL HISTORY: Social History   Tobacco Use  . Smoking status: Not on file  Substance Use Topics  . Alcohol use: No    Frequency: Never  . Drug use: No    FAMILY HISTORY: Family History  Problem Relation Age of Onset  . Diabetes Mother   . Obesity Mother   . Cancer Father     ROS: Review of Systems  Constitutional: Positive for weight loss.  Gastrointestinal: Negative for nausea and vomiting.  Genitourinary: Negative for frequency.  Musculoskeletal:       Negative for hypgolycemia  Endo/Heme/Allergies: Negative for polydipsia.       Negative for hypoglycemia    PHYSICAL EXAM: Blood pressure 124/86, pulse 71, temperature 97.9 F (36.6 C), temperature source Oral, height 5\' 9"  (1.753 m), weight 259 lb (117.5 kg), SpO2 99 %. Body mass index is 38.25 kg/m. Physical Exam  Constitutional: She is oriented to person, place, and time. She appears well-developed and well-nourished.  Cardiovascular: Normal rate.  Pulmonary/Chest: Effort normal.  Musculoskeletal: Normal range of motion.  Neurological: She is oriented to person, place, and time.  Skin: Skin is warm and dry.  Psychiatric: She has a normal mood and affect. Her behavior is normal.  Vitals reviewed.   RECENT LABS AND TESTS: BMET    Component Value Date/Time   NA 138 12/21/2017 1057   K 4.6 12/21/2017 1057   CL  102 12/21/2017 1057   CO2 20 12/21/2017 1057   GLUCOSE 97 12/21/2017 1057   BUN 14 12/21/2017 1057   CREATININE 0.71 12/21/2017 1057   CALCIUM 9.0 12/21/2017 1057   GFRNONAA 98 12/21/2017 1057   GFRAA 113 12/21/2017 1057   Lab Results  Component Value Date   HGBA1C 5.7 (H) 12/21/2017   Lab Results  Component Value Date   INSULIN 14.4 12/21/2017   CBC    Component Value Date/Time   WBC 6.5 12/21/2017 1057   RBC 4.41 12/21/2017 1057   HGB 12.9 12/21/2017 1057   HCT 39.8 12/21/2017 1057   MCV 90 12/21/2017 1057   MCH 29.3 12/21/2017 1057    MCHC 32.4 12/21/2017 1057   RDW 14.7 12/21/2017 1057   LYMPHSABS 1.9 12/21/2017 1057   EOSABS 0.3 12/21/2017 1057   BASOSABS 0.0 12/21/2017 1057   Iron/TIBC/Ferritin/ %Sat No results found for: IRON, TIBC, FERRITIN, IRONPCTSAT Lipid Panel     Component Value Date/Time   CHOL 252 (H) 12/21/2017 1057   TRIG 159 (H) 12/21/2017 1057   HDL 47 12/21/2017 1057   LDLCALC 173 (H) 12/21/2017 1057   Hepatic Function Panel     Component Value Date/Time   PROT 6.5 12/21/2017 1057   ALBUMIN 4.3 12/21/2017 1057   AST 25 12/21/2017 1057   ALT 14 12/21/2017 1057   ALKPHOS 80 12/21/2017 1057   BILITOT 0.3 12/21/2017 1057      Component Value Date/Time   TSH 0.981 12/21/2017 1057    Ref. Range 12/21/2017 10:57  Vitamin D, 25-Hydroxy Latest Ref Range: 30.0 - 100.0 ng/mL 18.6 (L)   ASSESSMENT AND PLAN: Vitamin D deficiency - Plan: Vitamin D, Ergocalciferol, (DRISDOL) 50000 units CAPS capsule  Prediabetes - Plan: metFORMIN (GLUCOPHAGE) 500 MG tablet  At risk for diabetes mellitus  Class 2 severe obesity with serious comorbidity and body mass index (BMI) of 38.0 to 38.9 in adult, unspecified obesity type (HCC)  PLAN:  Vitamin D Deficiency [name] was informed that low vitamin D levels contributes to fatigue and are associated with obesity, breast, and colon cancer. She agrees to continue to take prescription Vit D @50 ,000 IU every week #4 with no refills and will follow up for routine testing of vitamin D, at least 2-3 times per year. She was informed of the risk of over-replacement of vitamin D and agrees to not increase her dose unless she discusses this with [name] first. [name] agrees to follow up with our clinic in 2 weeks.  Pre-Diabetes [name] will continue to work on weight loss, exercise, and decreasing simple carbohydrates in her diet to help decrease the risk of diabetes. We dicussed metformin including benefits and risks. She was informed that eating too many simple carbohydrates or too  many calories at one sitting increases the likelihood of GI side effects. [name] requested metformin for now and a prescription was written today for 1 month refill. [name] agreed to follow her progress with her obesity treatment plan. She is on the Category 2 plan and is following her eating plan approximately 75 % of the time. She states she is exercising 0 minutes 0 times per week. Christy [name] continues to do well with weight loss, but is getting bored with dinner, and would like more options. Her weight is 259 lb (117.5 kg) today and has had a weight loss of 2 pounds over a period of 2 weeks since her last visit. She has lost 14 lbs since starting treatment with us.  Vitamin D deficiency Christy [name] has a diagnosis of vitamin D deficiency. She is currently taking vit D and is not yet at goal. She noted nausea when taking with metformin, but this resolved when she took it at another time. She denies vomiting or muscle weakness.   Ref. Range 12/21/2017 10:57  Vitamin D, 25-Hydroxy Latest Ref Range: 30.0 - 100.0 ng/mL 18.6 (L)   Pre-Diabetes Christy [name] has a diagnosis of pre-diabetes based on her elevated Hgb A1c and was informed this puts her at greater risk of developing diabetes. She is stable on metformin and is doing well on her diet. She continues to work on diet and exercise to decrease risk of diabetes. She denies nausea, vomiting or hypoglycemia.  At risk for diabetes Christy [name] is at higher than average risk for developing diabetes due to her obesity and pre-diabetes. She currently denies polyuria or polydipsia.  ALLERGIES: No Known Allergies  MEDICATIONS: Current Outpatient Medications on File Prior to Visit  Medication Sig Dispense Refill  . levothyroxine (SYNTHROID, LEVOTHROID) 150 MCG tablet Take 150 mcg by mouth daily before breakfast.     No current facility-administered medications on file prior to visit.     PAST MEDICAL HISTORY: Past Medical History:  Diagnosis Date  . Back pain   . Fatigue   . Hypothyroidism   . Infertility, female     PAST SURGICAL  HISTORY: No past surgical history on file.  SOCIAL HISTORY: Social History   Tobacco Use  . Smoking status: Not on file  Substance Use Topics  . Alcohol use: No    Frequency: Never  . Drug use: No    FAMILY HISTORY: Family History  Problem Relation Age of Onset  . Diabetes Mother   . Obesity Mother   . Cancer Father     ROS: Review of Systems  Constitutional: Positive for weight loss.  Gastrointestinal: Negative for nausea and vomiting.  Genitourinary: Negative for frequency.  Musculoskeletal:       Negative for hypgolycemia  Endo/Heme/Allergies: Negative for polydipsia.       Negative for hypoglycemia    PHYSICAL EXAM: Blood pressure 124/86, pulse 71, temperature 97.9 F (36.6 C), temperature source Oral, height 5\' 9"  (1.753 m), weight 259 lb (117.5 kg), SpO2 99 %. Body mass index is 38.25 kg/m. Physical Exam  Constitutional: She is oriented to person, place, and time. She appears well-developed and well-nourished.  Cardiovascular: Normal rate.  Pulmonary/Chest: Effort normal.  Musculoskeletal: Normal range of motion.  Neurological: She is oriented to person, place, and time.  Skin: Skin is warm and dry.  Psychiatric: She has a normal mood and affect. Her behavior is normal.  Vitals reviewed.   RECENT LABS AND TESTS: BMET    Component Value Date/Time   NA 138 12/21/2017 1057   K 4.6 12/21/2017 1057   CL  102 12/21/2017 1057   CO2 20 12/21/2017 1057   GLUCOSE 97 12/21/2017 1057   BUN 14 12/21/2017 1057   CREATININE 0.71 12/21/2017 1057   CALCIUM 9.0 12/21/2017 1057   GFRNONAA 98 12/21/2017 1057   GFRAA 113 12/21/2017 1057   Lab Results  Component Value Date   HGBA1C 5.7 (H) 12/21/2017   Lab Results  Component Value Date   INSULIN 14.4 12/21/2017   CBC    Component Value Date/Time   WBC 6.5 12/21/2017 1057   RBC 4.41 12/21/2017 1057   HGB 12.9 12/21/2017 1057   HCT 39.8 12/21/2017 1057   MCV 90 12/21/2017 1057   MCH 29.3 12/21/2017 1057    MCHC 32.4 12/21/2017 1057   RDW 14.7 12/21/2017 1057   LYMPHSABS 1.9 12/21/2017 1057   EOSABS 0.3 12/21/2017 1057   BASOSABS 0.0 12/21/2017 1057   Iron/TIBC/Ferritin/ %Sat No results found for: IRON, TIBC, FERRITIN, IRONPCTSAT Lipid Panel     Component Value Date/Time   CHOL 252 (H) 12/21/2017 1057   TRIG 159 (H) 12/21/2017 1057   HDL 47 12/21/2017 1057   LDLCALC 173 (H) 12/21/2017 1057   Hepatic Function Panel     Component Value Date/Time   PROT 6.5 12/21/2017 1057   ALBUMIN 4.3 12/21/2017 1057   AST 25 12/21/2017 1057   ALT 14 12/21/2017 1057   ALKPHOS 80 12/21/2017 1057   BILITOT 0.3 12/21/2017 1057      Component Value Date/Time   TSH 0.981 12/21/2017 1057    Ref. Range 12/21/2017 10:57  Vitamin D, 25-Hydroxy Latest Ref Range: 30.0 - 100.0 ng/mL 18.6 (L)   ASSESSMENT AND PLAN: Vitamin D deficiency - Plan: Vitamin D, Ergocalciferol, (DRISDOL) 50000 units CAPS capsule  Prediabetes - Plan: metFORMIN (GLUCOPHAGE) 500 MG tablet  At risk for diabetes mellitus  Class 2 severe obesity with serious comorbidity and body mass index (BMI) of 38.0 to 38.9 in adult, unspecified obesity type (HCC)  PLAN:  Vitamin D Deficiency [name] was informed that low vitamin D levels contributes to fatigue and are associated with obesity, breast, and colon cancer. She agrees to continue to take prescription Vit D @50 ,000 IU every week #4 with no refills and will follow up for routine testing of vitamin D, at least 2-3 times per year. She was informed of the risk of over-replacement of vitamin D and agrees to not increase her dose unless she discusses this with [name] first. [name] agrees to follow up with our clinic in 2 weeks.  Pre-Diabetes [name] will continue to work on weight loss, exercise, and decreasing simple carbohydrates in her diet to help decrease the risk of diabetes. We dicussed metformin including benefits and risks. She was informed that eating too many simple carbohydrates or too  many calories at one sitting increases the likelihood of GI side effects. [name] requested metformin for now and a prescription was written today for 1 month refill. [name] agreed to follow up with [name] as directed to monitor her progress.  Diabetes risk counseling Kawthar was given extended (15 minutes) diabetes prevention counseling today. She is 52 y.o. female and has risk factors for diabetes including obesity and pre-diabetes. We discussed intensive lifestyle modifications today with an emphasis on weight loss as well as increasing exercise and decreasing simple carbohydrates in her diet.  Obesity Taleia is currently in the action stage of change. As such, her goal is to continue with weight loss efforts She has agreed to keep a food journal with 400 to 550  calories and 35 grams of protein at supper daily and follow the Category 2 plan Shawntel has been instructed to work up to a goal of 150 minutes of combined cardio and strengthening exercise per week for weight loss and overall health benefits. We discussed the following Behavioral Modification Strategies today: keep a strict food journal, increasing lean protein intake, decreasing simple carbohydrates  and work on meal planning and easy cooking plans  Monae has agreed to follow up with our clinic in 2 weeks. She was informed of the importance of frequent follow up visits to maximize her success with intensive lifestyle modifications for her multiple health conditions.   OBESITY BEHAVIORAL INTERVENTION VISIT  Today's visit was # 4 out of 22.  Starting weight: 273 lbs Starting date: 12/21/17 Today's weight : 259 lbs Today's date: 02/02/2018 Total lbs lost to date: 14 (Patients must lose 7 lbs in the first 6 months to continue with counseling)   ASK: We discussed the diagnosis of obesity with Melburn Hake today and Cielle agreed to give [name] permission to discuss obesity behavioral modification therapy today.  ASSESS: Shataya has the diagnosis of  obesity and her BMI today is 38.23 Mihira is in the action stage of change   ADVISE: Stefany was educated on the multiple health risks of obesity as well as the benefit of weight loss to improve her health. She was advised of the need for long term treatment and the importance of lifestyle modifications.  AGREE: Multiple dietary modification options and treatment options were discussed and  Olivia agreed to the above obesity treatment plan.  I, [place] Crane, am acting as transcriptionist for Quillian Quince, MD  I have reviewed the above documentation for accuracy and completeness, and I agree with the above. -Quillian Quince, MD

## 2018-02-16 ENCOUNTER — Ambulatory Visit (INDEPENDENT_AMBULATORY_CARE_PROVIDER_SITE_OTHER): Payer: BC Managed Care – PPO | Admitting: Family Medicine

## 2018-02-16 VITALS — BP 116/77 | HR 75 | Temp 98.1°F | Ht 69.0 in | Wt 258.0 lb

## 2018-02-16 DIAGNOSIS — R7303 Prediabetes: Secondary | ICD-10-CM | POA: Diagnosis not present

## 2018-02-16 DIAGNOSIS — E559 Vitamin D deficiency, unspecified: Secondary | ICD-10-CM

## 2018-02-16 DIAGNOSIS — Z6838 Body mass index (BMI) 38.0-38.9, adult: Secondary | ICD-10-CM

## 2018-02-17 NOTE — Progress Notes (Signed)
Office: [phone]  /  Fax: [phone]   HPI:   Chief Complaint: OBESITY Christy [name] is here to discuss her progress with her obesity treatment plan. She is on the keep a food journal with 400-550 calories and 35 grams of protein at supper daily and follow the Category 2 plan and is following her eating plan approximately 75 % of the time. She states she is exercising 0 minutes 0 times per week. Christy [name] found journaling difficult as she cooks many different meals at once, often reverted back to Category 2 dinner.  Her weight is 258 lb (117 kg) today and has had a weight loss of 1 pound over a period of 2 weeks since her last visit. She has lost 15 lbs since starting treatment with [name].  Vitamin D Deficiency Christy [name] has a diagnosis of vitamin D deficiency. She is currently taking prescription Vit D. She notes fatigue and denies nausea, vomiting or muscle weakness.  Pre-Diabetes Christy [name] has a diagnosis of pre-diabetes based on her elevated Hgb A1c and was informed this puts her at greater risk of developing diabetes. She is not taking metformin currently and continues to work on diet and exercise to decrease risk of diabetes. She denies polyuria, polydipsia, nausea, or hypoglycemia.  ALLERGIES: No Known Allergies  MEDICATIONS: Current Outpatient Medications on File Prior to Visit  Medication Sig Dispense Refill  . levothyroxine (SYNTHROID, LEVOTHROID) 150 MCG tablet Take 150 mcg by mouth daily before breakfast.    . metFORMIN (GLUCOPHAGE) 500 MG tablet Take 1 tablet (500 mg total) by mouth daily with breakfast. 30 tablet 0  . Vitamin D, Ergocalciferol, (DRISDOL) 50000 units CAPS capsule Take 1 capsule (50,000 Units total) by mouth every 7 (seven) days. 4 capsule 0   No current facility-administered medications on file prior to visit.     PAST MEDICAL HISTORY: Past Medical History:  Diagnosis Date  . Back pain   . Fatigue   . Hypothyroidism   . Infertility, female     PAST SURGICAL  HISTORY: No past surgical history on file.  SOCIAL HISTORY: Social History   Tobacco Use  . Smoking status: Not on file  Substance Use Topics  . Alcohol use: No    Frequency: Never  . Drug use: No    FAMILY HISTORY: Family History  Problem Relation Age of Onset  . Diabetes Mother   . Obesity Mother   . Cancer Father     ROS: Review of Systems  Constitutional: Positive for malaise/fatigue and weight loss.  Gastrointestinal: Negative for nausea and vomiting.  Genitourinary: Negative for frequency.  Musculoskeletal:       Negative muscle weakness  Endo/Heme/Allergies: Negative for polydipsia.       Negative hypoglycemia    PHYSICAL EXAM: Blood pressure 116/77, pulse 75, temperature 98.1 F (36.7 C), temperature source Oral, height 5\' 9"  (1.753 m), weight 258 lb (117 kg), SpO2 98 %. Body mass index is 38.1 kg/m. Physical Exam  Constitutional: She is oriented to person, place, and time. She appears well-developed and well-nourished.  Cardiovascular: Normal rate.  Pulmonary/Chest: Effort normal.  Musculoskeletal: Normal range of motion.  Neurological: She is oriented to person, place, and time.  Skin: Skin is warm and dry.  Psychiatric: She has a normal mood and affect. Her behavior is normal.  Vitals reviewed.   RECENT LABS AND TESTS: BMET    Component Value Date/Time   NA 138 12/21/2017 1057   K 4.6 12/21/2017 1057   CL 102 12/21/2017 1057  CO2 20 12/21/2017 1057   GLUCOSE 97 12/21/2017 1057   BUN 14 12/21/2017 1057   CREATININE 0.71 12/21/2017 1057   CALCIUM 9.0 12/21/2017 1057   GFRNONAA 98 12/21/2017 1057   GFRAA 113 12/21/2017 1057   Lab Results  Component Value Date   HGBA1C 5.7 (H) 12/21/2017   Lab Results  Component Value Date   INSULIN 14.4 12/21/2017   CBC    Component Value Date/Time   WBC 6.5 12/21/2017 1057   RBC 4.41 12/21/2017 1057   HGB 12.9 12/21/2017 1057   HCT 39.8 12/21/2017 1057   MCV 90 12/21/2017 1057   MCH 29.3  12/21/2017 1057   MCHC 32.4 12/21/2017 1057   RDW 14.7 12/21/2017 1057   LYMPHSABS 1.9 12/21/2017 1057   EOSABS 0.3 12/21/2017 1057   BASOSABS 0.0 12/21/2017 1057   Iron/TIBC/Ferritin/ %Sat No results found for: IRON, TIBC, FERRITIN, IRONPCTSAT Lipid Panel     Component Value Date/Time   CHOL 252 (H) 12/21/2017 1057   TRIG 159 (H) 12/21/2017 1057   HDL 47 12/21/2017 1057   LDLCALC 173 (H) 12/21/2017 1057   Hepatic Function Panel     Component Value Date/Time   PROT 6.5 12/21/2017 1057   ALBUMIN 4.3 12/21/2017 1057   AST 25 12/21/2017 1057   ALT 14 12/21/2017 1057   ALKPHOS 80 12/21/2017 1057   BILITOT 0.3 12/21/2017 1057      Component Value Date/Time   TSH 0.981 12/21/2017 1057  Results for DANYLLE, OUK (MRN [id number]) as of 02/17/2018 12:02  Ref. Range 12/21/2017 10:57  Vitamin D, 25-Hydroxy Latest Ref Range: 30.0 - 100.0 ng/mL 18.6 (L)    ASSESSMENT AND PLAN: Vitamin D deficiency  Prediabetes  Class 2 severe obesity with serious comorbidity and body mass index (BMI) of 38.0 to 38.9 in adult, unspecified obesity type (HCC)  PLAN:  Vitamin D Deficiency Christy [name] was informed that low vitamin D levels contributes to fatigue and are associated with obesity, breast, and colon cancer. Christy [name] agrees to continue taking prescription Vit D @50 ,000 IU every week #4 and she is picking up prescription at pharmacy after leaving today. She will follow up for routine testing of vitamin D, at least 2-3 times per year. She was informed of the risk of over-replacement of vitamin D and agrees to not increase her dose unless she discusses this with [name] first. Christy [name] agrees to follow up with our clinic in 2 weeks.  Pre-Diabetes Christy [name] will continue to work on weight loss, exercise, and decreasing simple carbohydrates in her diet to help decrease the risk of diabetes. We dicussed metformin including benefits and risks. She was informed that eating too many simple carbohydrates or too many calories  at one sitting increases the likelihood of GI side effects. Christy [name] declined metformin for now and a prescription was not written today. Christy [name] is to attempt journaling again for dinner and she agrees to follow up with our clinic in 2 weeks as directed to monitor her progress.  We spent > than 50% of the 15 minute visit on the counseling as documented in the note.  Obesity Christy [name] is currently in the action stage of change. As such, her goal is to continue with weight loss efforts She has agreed to keep a food journal with 400-550 calories and 35 grams of protein at supper daily and follow the Category 2 plan Christy [name] has been instructed to work up to a goal of 150 minutes of combined cardio and strengthening exercise per week for  weight loss and overall health benefits. We discussed the following Behavioral Modification Strategies today: increasing lean protein intake, work on meal planning and easy cooking plans, planning for success, and keep a strict food journal   Christy [name] has agreed to follow up with our clinic in 2 weeks. She was informed of the importance of frequent follow up visits to maximize her success with intensive lifestyle modifications for her multiple health conditions.   OBESITY BEHAVIORAL INTERVENTION VISIT  Today's visit was # 5 out of 22.  Starting weight: 273 lbs Starting date: 12/21/17 Today's weight : 258 lbs Today's date: 02/16/2018 Total lbs lost to date: 15 (Patients must lose 7 lbs in the first 6 months to continue with counseling)   ASK: We discussed the diagnosis of obesity with Christy [name] today and Christy [name] agreed to give us permission to discuss obesity behavioral modification therapy today.  ASSESS: Christy [name] has the diagnosis of obesity and her BMI today is 38.08 Christy [name] is in the action stage of change   ADVISE: Christy [name] was educated on the multiple health risks of obesity as well as the benefit of weight loss to improve her health. She was advised of the need for  long term treatment and the importance of lifestyle modifications.  AGREE: Multiple dietary modification options and treatment options were discussed and  Christy [name] agreed to the above obesity treatment plan.  I, Burt KnackSharon Martin, am acting as transcriptionist for Debbra RidingAlexandria Kadolph, MD  I have reviewed the above documentation for accuracy and completeness, and I agree with the above. - Debbra RidingAlexandria Kadolph, MD

## 2018-03-02 ENCOUNTER — Ambulatory Visit (INDEPENDENT_AMBULATORY_CARE_PROVIDER_SITE_OTHER): Payer: BC Managed Care – PPO | Admitting: Family Medicine

## 2018-03-13 ENCOUNTER — Other Ambulatory Visit (INDEPENDENT_AMBULATORY_CARE_PROVIDER_SITE_OTHER): Payer: Self-pay | Admitting: Family Medicine

## 2018-03-13 DIAGNOSIS — E559 Vitamin D deficiency, unspecified: Secondary | ICD-10-CM

## 2018-03-15 ENCOUNTER — Other Ambulatory Visit (INDEPENDENT_AMBULATORY_CARE_PROVIDER_SITE_OTHER): Payer: Self-pay | Admitting: Family Medicine

## 2018-03-15 DIAGNOSIS — R7303 Prediabetes: Secondary | ICD-10-CM

## 2018-03-16 ENCOUNTER — Ambulatory Visit (INDEPENDENT_AMBULATORY_CARE_PROVIDER_SITE_OTHER): Payer: BC Managed Care – PPO | Admitting: Family Medicine

## 2018-03-16 VITALS — BP 116/84 | HR 78 | Temp 98.0°F | Ht 69.0 in | Wt 256.0 lb

## 2018-03-16 DIAGNOSIS — R7303 Prediabetes: Secondary | ICD-10-CM

## 2018-03-16 DIAGNOSIS — Z9189 Other specified personal risk factors, not elsewhere classified: Secondary | ICD-10-CM

## 2018-03-16 DIAGNOSIS — Z6837 Body mass index (BMI) 37.0-37.9, adult: Secondary | ICD-10-CM

## 2018-03-16 DIAGNOSIS — E038 Other specified hypothyroidism: Secondary | ICD-10-CM

## 2018-03-16 DIAGNOSIS — E559 Vitamin D deficiency, unspecified: Secondary | ICD-10-CM | POA: Diagnosis not present

## 2018-03-16 MED ORDER — METFORMIN HCL 500 MG PO TABS: 500.0000 mg | ORAL_TABLET | Freq: Every day | ORAL | 0 refills | Status: DC

## 2018-03-16 MED ORDER — LEVOTHYROXINE SODIUM 150 MCG PO TABS: 150.0000 ug | ORAL_TABLET | Freq: Every day | ORAL | 0 refills | Status: DC

## 2018-03-16 MED ORDER — VITAMIN D (ERGOCALCIFEROL) 1.25 MG (50000 UNIT) PO CAPS: 50000.0000 [IU] | ORAL_CAPSULE | ORAL | 0 refills | Status: DC

## 2018-03-16 NOTE — Progress Notes (Signed)
Office: [phone]  /  Fax: [phone]   HPI:   Chief Complaint: OBESITY Christy [name] is here to discuss her progress with her obesity treatment plan. She is on the keep a food journal with 400-550 calories and 35 protein at supper daily and follow the Category 2 plan and is following her eating plan approximately 50 % of the time. She states she is exercising 0 minutes 0 times per week. Christy [name] is ill with upper respiratory infection and possible conjunctivitis.  Her weight is 256 lb (116.1 kg) today and has had a weight loss of 2 pounds over a period of 4 weeks since her last visit. She has lost 17 lbs since starting treatment with [name].  Hypothyroidism Christy [name] has a diagnosis of hypothyroidism. She is on levothyroxine. She denies hot or cold intolerance, palpitations, or fatigue.  Vitamin D Deficiency Christy [name] has a diagnosis of vitamin D deficiency. She is currently taking prescription Vit D and denies nausea, vomiting or muscle weakness.  At risk for osteopenia and osteoporosis Christy [name] is at higher risk of osteopenia and osteoporosis due to vitamin D deficiency.   Pre-Diabetes Christy [name] has a diagnosis of pre-diabetes based on her elevated Hgb A1c and was informed this puts her at greater risk of developing diabetes. She is taking metformin currently and continues to work on diet and exercise to decrease risk of diabetes. She denies GI upset, nausea or hypoglycemia.  ALLERGIES: No Known Allergies  MEDICATIONS: Current Outpatient Medications on File Prior to Visit  Medication Sig Dispense Refill  . levothyroxine (SYNTHROID, LEVOTHROID) 150 MCG tablet Take 150 mcg by mouth daily before breakfast.    . metFORMIN (GLUCOPHAGE) 500 MG tablet Take 1 tablet (500 mg total) by mouth daily with breakfast. 30 tablet 0  . Vitamin D, Ergocalciferol, (DRISDOL) 50000 units CAPS capsule Take 1 capsule (50,000 Units total) by mouth every 7 (seven) days. 4 capsule 0   No current facility-administered medications  on file prior to visit.     PAST MEDICAL HISTORY: Past Medical History:  Diagnosis Date  . Back pain   . Fatigue   . Hypothyroidism   . Infertility, female     PAST SURGICAL HISTORY: No past surgical history on file.  SOCIAL HISTORY: Social History   Tobacco Use  . Smoking status: Not on file  Substance Use Topics  . Alcohol use: No    Frequency: Never  . Drug use: No    FAMILY HISTORY: Family History  Problem Relation Age of Onset  . Diabetes Mother   . Obesity Mother   . Cancer Father     ROS: Review of Systems  Constitutional: Positive for weight loss. Negative for malaise/fatigue.       Negative hot/cold intolerance  Cardiovascular: Negative for chest pain and palpitations.  Gastrointestinal: Negative for nausea and vomiting.  Musculoskeletal:       Negative muscle weakness  Endo/Heme/Allergies:       Negative hypoglycemia    PHYSICAL EXAM: Blood pressure 116/84, pulse 78, temperature 98 F (36.7 C), temperature source Oral, height 5\' 9"  (1.753 m), weight 256 lb (116.1 kg), SpO2 99 %. Body mass index is 37.8 kg/m. Physical Exam  Constitutional: She is oriented to person, place, and time. She appears well-developed and well-nourished.  Cardiovascular: Normal rate.  Pulmonary/Chest: Effort normal.  Musculoskeletal: Normal range of motion.  Neurological: She is oriented to person, place, and time.  Skin: Skin is warm and dry.  Psychiatric: She has a normal mood and affect. Her  behavior is normal.  Vitals reviewed.   RECENT LABS AND TESTS: BMET    Component Value Date/Time   NA 138 12/21/2017 1057   K 4.6 12/21/2017 1057   CL 102 12/21/2017 1057   CO2 20 12/21/2017 1057   GLUCOSE 97 12/21/2017 1057   BUN 14 12/21/2017 1057   CREATININE 0.71 12/21/2017 1057   CALCIUM 9.0 12/21/2017 1057   GFRNONAA 98 12/21/2017 1057   GFRAA 113 12/21/2017 1057   Lab Results  Component Value Date   HGBA1C 5.7 (H) 12/21/2017   Lab Results  Component Value  Date   INSULIN 14.4 12/21/2017   CBC    Component Value Date/Time   WBC 6.5 12/21/2017 1057   RBC 4.41 12/21/2017 1057   HGB 12.9 12/21/2017 1057   HCT 39.8 12/21/2017 1057   MCV 90 12/21/2017 1057   MCH 29.3 12/21/2017 1057   MCHC 32.4 12/21/2017 1057   RDW 14.7 12/21/2017 1057   LYMPHSABS 1.9 12/21/2017 1057   EOSABS 0.3 12/21/2017 1057   BASOSABS 0.0 12/21/2017 1057   Iron/TIBC/Ferritin/ %Sat No results found for: IRON, TIBC, FERRITIN, IRONPCTSAT Lipid Panel     Component Value Date/Time   CHOL 252 (H) 12/21/2017 1057   TRIG 159 (H) 12/21/2017 1057   HDL 47 12/21/2017 1057   LDLCALC 173 (H) 12/21/2017 1057   Hepatic Function Panel     Component Value Date/Time   PROT 6.5 12/21/2017 1057   ALBUMIN 4.3 12/21/2017 1057   AST 25 12/21/2017 1057   ALT 14 12/21/2017 1057   ALKPHOS 80 12/21/2017 1057   BILITOT 0.3 12/21/2017 1057      Component Value Date/Time   TSH 0.981 12/21/2017 1057  Results for MALIA, CORSI (MRN [id number]) as of 03/16/2018 09:44  Ref. Range 12/21/2017 10:57  Vitamin D, 25-Hydroxy Latest Ref Range: 30.0 - 100.0 ng/mL 18.6 (L)    ASSESSMENT AND PLAN: Other specified hypothyroidism - Plan: levothyroxine (SYNTHROID, LEVOTHROID) 150 MCG tablet  Vitamin D deficiency - Plan: Vitamin D, Ergocalciferol, (DRISDOL) 50000 units CAPS capsule  Prediabetes - Plan: metFORMIN (GLUCOPHAGE) 500 MG tablet  At risk for osteopenia  Class 2 severe obesity with serious comorbidity and body mass index (BMI) of 37.0 to 37.9 in adult, unspecified obesity type (HCC)  PLAN:  Hypothyroidism Christy [name] was informed of the importance of good thyroid control to help with weight loss efforts. She was also informed that supertheraputic thyroid levels are dangerous and will not improve weight loss results. Christy [name] agrees to continue taking levothyroxine 150 mcg PO q AM #30 with no refills. Christy [name] agrees to follow up with our clinic in 2 weeks.  Vitamin D Deficiency Christy [name] was  informed that low vitamin D levels contributes to fatigue and are associated with obesity, breast, and colon cancer. Christy [name] agrees to continue taking prescription Vit D @50 ,000 IU every week #4 with no refills. She will follow up for routine testing of vitamin D, at least 2-3 times per year. She was informed of the risk of over-replacement of vitamin D and agrees to not increase her dose unless she discusses this with [name] first. Christy [name] agrees to follow up with our clinic in 2 weeks.  At risk for osteopenia and osteoporosis Christy [name] is at risk for osteopenia and osteoporsis due to her vitamin D deficiency. She was encouraged to take her vitamin D and follow her higher calcium diet and increase strengthening exercise to help strengthen her bones and decrease her risk of osteopenia and osteoporosis.  Pre-Diabetes Christy [name]  will continue to work on weight loss, exercise, and decreasing simple carbohydrates in her diet to help decrease the risk of diabetes. We dicussed metformin including benefits and risks. She was informed that eating too many simple carbohydrates or too many calories at one sitting increases the likelihood of GI side effects. Christy [name] agrees to continue taking metformin 500 mg PO q AM #30 with no refills. Christy [name] agrees to follow up with our clinic in 2 weeks as directed to monitor her progress.  Obesity Christy [name] is currently in the action stage of change. As such, her goal is to continue with weight loss efforts She has agreed to keep a food journal with 400-550 calories and 35 grams of protein at supper daily and follow the Category 2 plan Christy [name] has been instructed to work up to a goal of 150 minutes of combined cardio and strengthening exercise per week for weight loss and overall health benefits. We discussed the following Behavioral Modification Strategies today: increasing lean protein intake, work on meal planning and easy cooking plans, better snacking choices, and planning for success Resistance  training discussion at next visit.  Christy [name] has agreed to follow up with our clinic in 2 weeks. She was informed of the importance of frequent follow up visits to maximize her success with intensive lifestyle modifications for her multiple health conditions.   OBESITY BEHAVIORAL INTERVENTION VISIT  Today's visit was # 6 out of 22.  Starting weight: 273 lbs Starting date: 12/21/17 Today's weight : 256 lbs  Today's date: 03/16/2018 Total lbs lost to date: 17 (Patients must lose 7 lbs in the first 6 months to continue with counseling)   ASK: We discussed the diagnosis of obesity with Christy [name] today and Christy [name] agreed to give us permission to discuss obesity behavioral modification therapy today.  ASSESS: Christy [name] has the diagnosis of obesity and her BMI today is 4137.79 Christy [name] is in the action stage of change   ADVISE: Christy [name] was educated on the multiple health risks of obesity as well as the benefit of weight loss to improve her health. She was advised of the need for long term treatment and the importance of lifestyle modifications.  AGREE: Multiple dietary modification options and treatment options were discussed and  Christy [name] agreed to the above obesity treatment plan.  I, Burt KnackSharon Martin, am acting as transcriptionist for Debbra RidingAlexandria Kadolph, MD  I have reviewed the above documentation for accuracy and completeness, and I agree with the above. - Debbra RidingAlexandria Kadolph, MD

## 2018-04-03 ENCOUNTER — Ambulatory Visit (INDEPENDENT_AMBULATORY_CARE_PROVIDER_SITE_OTHER): Payer: BC Managed Care – PPO | Admitting: Family Medicine

## 2018-04-03 VITALS — BP 120/82 | HR 77 | Temp 97.6°F | Ht 69.0 in | Wt 254.0 lb

## 2018-04-03 DIAGNOSIS — Z6837 Body mass index (BMI) 37.0-37.9, adult: Secondary | ICD-10-CM

## 2018-04-03 DIAGNOSIS — Z9189 Other specified personal risk factors, not elsewhere classified: Secondary | ICD-10-CM

## 2018-04-03 DIAGNOSIS — E038 Other specified hypothyroidism: Secondary | ICD-10-CM | POA: Diagnosis not present

## 2018-04-03 DIAGNOSIS — E559 Vitamin D deficiency, unspecified: Secondary | ICD-10-CM | POA: Diagnosis not present

## 2018-04-03 DIAGNOSIS — R7303 Prediabetes: Secondary | ICD-10-CM | POA: Diagnosis not present

## 2018-04-03 NOTE — Progress Notes (Signed)
Office: [phone]  /  Fax: [phone]   HPI:   Chief Complaint: OBESITY Christy [name] is here to discuss her progress with her obesity treatment plan. She is on the keep a food journal with 400-550 calories and 35 grams of protein at supper daily and follow the Category 2 plan and is following her eating plan approximately 75 % of the time. She states she is exercising 0 minutes 0 times per week. Christy [name] was sick for the past 2 weeks with sinus infection and with significant hearing loss. She was on Amoxicillin. Eating almost all of meal plan. Going to [employer] next week.  Her weight is 254 lb (115.2 kg) today and has had a weight loss of 2 pounds over a period of 2 weeks since her last visit. She has lost 19 lbs since starting treatment with [name].  Hypothyroidism Christy [name] has a diagnosis of hypothyroidism. She is on levothyroxine 150 mcg PO daily. She denies hot or cold intolerance or palpitations.  Pre-Diabetes Christy [name] has a diagnosis of pre-diabetes based on her elevated Hgb A1c and was informed this puts her at greater risk of developing diabetes. She is on metformin currently and continues to work on diet and exercise to decrease risk of diabetes. She denies hypoglycemia events or carbohydrate cravings.  At risk for diabetes Christy [name] is at higher than average risk for developing diabetes due to her obesity and pre-diabetes. She currently denies polyuria or polydipsia.  Vitamin D Deficiency Christy [name] has a diagnosis of vitamin D deficiency. She is on presciption Vit D 50,000 IU weekly and denies nausea, vomiting or muscle weakness.  ALLERGIES: No Known Allergies  MEDICATIONS: Current Outpatient Medications on File Prior to Visit  Medication Sig Dispense Refill  . levothyroxine (SYNTHROID, LEVOTHROID) 150 MCG tablet Take 1 tablet (150 mcg total) by mouth daily before breakfast. 30 tablet 0  . metFORMIN (GLUCOPHAGE) 500 MG tablet Take 1 tablet (500 mg total) by mouth daily with breakfast. 30 tablet 0    . Vitamin D, Ergocalciferol, (Christy [name]) 50000 units CAPS capsule Take 1 capsule (50,000 Units total) by mouth every 7 (seven) days. 4 capsule 0   No current facility-administered medications on file prior to visit.     PAST MEDICAL HISTORY: Past Medical History:  Diagnosis Date  . Back pain   . Fatigue   . Hypothyroidism   . Infertility, female     PAST SURGICAL HISTORY: No past surgical history on file.  SOCIAL HISTORY: Social History   Tobacco Use  . Smoking status: Not on file  Substance Use Topics  . Alcohol use: No    Frequency: Never  . Drug use: No    FAMILY HISTORY: Family History  Problem Relation Age of Onset  . Diabetes Mother   . Obesity Mother   . Cancer Father     ROS: Review of Systems  Constitutional: Positive for weight loss.       Negative hot/cold intolerance  Cardiovascular: Negative for palpitations.  Gastrointestinal: Negative for nausea and vomiting.  Genitourinary: Negative for frequency.  Musculoskeletal:       Negative muscle weakness  Endo/Heme/Allergies: Negative for polydipsia.       Negative hypoglycemia    PHYSICAL EXAM: Blood pressure 120/82, pulse 77, temperature 97.6 F (36.4 C), temperature source Oral, height 5\' 9"  (1.753 m), weight 254 lb (115.2 kg), SpO2 100 %. Body mass index is 37.51 kg/m. Physical Exam  Constitutional: She is oriented to person, place, and time. She appears well-developed and well-nourished.  Cardiovascular: Normal rate.  Pulmonary/Chest: Effort normal.  Musculoskeletal: Normal range of motion.  Neurological: She is oriented to person, place, and time.  Skin: Skin is warm and dry.  Psychiatric: She has a normal mood and affect. Her behavior is normal.  Vitals reviewed.   RECENT LABS AND TESTS: BMET    Component Value Date/Time   NA 138 12/21/2017 1057   K 4.6 12/21/2017 1057   CL 102 12/21/2017 1057   CO2 20 12/21/2017 1057   GLUCOSE 97 12/21/2017 1057   BUN 14 12/21/2017 1057    CREATININE 0.71 12/21/2017 1057   CALCIUM 9.0 12/21/2017 1057   GFRNONAA 98 12/21/2017 1057   GFRAA 113 12/21/2017 1057   Lab Results  Component Value Date   HGBA1C 5.7 (H) 12/21/2017   Lab Results  Component Value Date   INSULIN 14.4 12/21/2017   CBC    Component Value Date/Time   WBC 6.5 12/21/2017 1057   RBC 4.41 12/21/2017 1057   HGB 12.9 12/21/2017 1057   HCT 39.8 12/21/2017 1057   MCV 90 12/21/2017 1057   MCH 29.3 12/21/2017 1057   MCHC 32.4 12/21/2017 1057   RDW 14.7 12/21/2017 1057   LYMPHSABS 1.9 12/21/2017 1057   EOSABS 0.3 12/21/2017 1057   BASOSABS 0.0 12/21/2017 1057   Iron/TIBC/Ferritin/ %Sat No results found for: IRON, TIBC, FERRITIN, IRONPCTSAT Lipid Panel     Component Value Date/Time   CHOL 252 (H) 12/21/2017 1057   TRIG 159 (H) 12/21/2017 1057   HDL 47 12/21/2017 1057   LDLCALC 173 (H) 12/21/2017 1057   Hepatic Function Panel     Component Value Date/Time   PROT 6.5 12/21/2017 1057   ALBUMIN 4.3 12/21/2017 1057   AST 25 12/21/2017 1057   ALT 14 12/21/2017 1057   ALKPHOS 80 12/21/2017 1057   BILITOT 0.3 12/21/2017 1057      Component Value Date/Time   TSH 0.981 12/21/2017 1057  Results for Christy HakeGERTON, Christy (MRN [id number]) as of 04/03/2018 10:22  Ref. Range 12/21/2017 10:57  Vitamin D, 25-Hydroxy Latest Ref Range: 30.0 - 100.0 ng/mL 18.6 (L)    ASSESSMENT AND PLAN: Other specified hypothyroidism - Plan: T3, T4, free, TSH  Prediabetes - Plan: Comprehensive metabolic panel, Hemoglobin A1c, Insulin, random, Lipid Panel With LDL/HDL Ratio  Vitamin D deficiency - Plan: VITAMIN D 25 Hydroxy (Vit-D Deficiency, Fractures)  At risk for diabetes mellitus  Class 2 severe obesity with serious comorbidity and body mass index (BMI) of 37.0 to 37.9 in adult, unspecified obesity type (HCC)  PLAN:  Hypothyroidism Christy [name] was informed of the importance of good thyroid control to help with weight loss efforts. She was also informed that supertheraputic  thyroid levels are dangerous and will not improve weight loss results. Christy [name] will continue taking levothyroxine and we will check labs today. Christy [name] agrees to follow up with our clinic in 2 weeks.   Pre-Diabetes Christy [name] will continue to work on weight loss, exercise, and decreasing simple carbohydrates in her diet to help decrease the risk of diabetes. We dicussed metformin including benefits and risks. She was informed that eating too many simple carbohydrates or too many calories at one sitting increases the likelihood of GI side effects. Christy [name] agrees to continue taking metformin and we will check labs today. Christy [name] agrees to follow up with our clinic in 2 weeks as directed to monitor her progress.  Diabetes risk counselling Christy [name] was given extended (15 minutes) diabetes prevention counseling today. She is 52 y.o. female and has  risk factors for diabetes including obesity and pre-diabetes. We discussed intensive lifestyle modifications today with an emphasis on weight loss as well as increasing exercise and decreasing simple carbohydrates in her diet.  Vitamin D Deficiency Christy [name] was informed that low vitamin D levels contributes to fatigue and are associated with obesity, breast, and colon cancer. Christy [name] agrees to continue taking prescription Vit D @50 ,000 IU every week and well will check labs today. She will follow up for routine testing of vitamin D, at least 2-3 times per year. She was informed of the risk of over-replacement of vitamin D and agrees to not increase her dose unless she discusses this with [name] first. Christy [name] agrees to follow up with our clinic in 2 weeks.  Obesity Christy [name] is currently in the action stage of change. As such, her goal is to continue with weight loss efforts She has agreed to follow the Category 2 plan Christy [name] has been instructed to work up to a goal of 150 minutes of combined cardio and strengthening exercise per week for weight loss and overall health benefits. We discussed  the following Behavioral Modification Strategies today: increasing lean protein intake, work on meal planning and easy cooking plans, dealing with family or coworker sabotage, increase H20 intake, better snacking choices, travel eating strategies, and planning for success   Christy [name] has agreed to follow up with our clinic in 2 weeks. She was informed of the importance of frequent follow up visits to maximize her success with intensive lifestyle modifications for her multiple health conditions.   OBESITY BEHAVIORAL INTERVENTION VISIT  Today's visit was # 7 out of 22.  Starting weight: 273 lbs Starting date: 12/21/17 Today's weight : 254 lbs Today's date: 04/03/2018 Total lbs lost to date: 71 (Patients must lose 7 lbs in the first 6 months to continue with counseling)   ASK: We discussed the diagnosis of obesity with Christy [name] today and Marien agreed to give [name] permission to discuss obesity behavioral modification therapy today.  ASSESS: Alandra has the diagnosis of obesity and her BMI today is 37.49 Sheralyn is in the action stage of change   ADVISE: Sanaii was educated on the multiple health risks of obesity as well as the benefit of weight loss to improve her health. She was advised of the need for long term treatment and the importance of lifestyle modifications.  AGREE: Multiple dietary modification options and treatment options were discussed and  Arwilda agreed to the above obesity treatment plan.  I, Burt Knack, am acting as transcriptionist for Debbra Riding, MD  I have reviewed the above documentation for accuracy and completeness, and I agree with the above. - Debbra Riding, MD

## 2018-04-04 LAB — LIPID PANEL WITH LDL/HDL RATIO
Cholesterol, Total: 228 mg/dL — ABNORMAL HIGH (ref 100–199)
HDL: 40 mg/dL
LDL Calculated: 164 mg/dL — ABNORMAL HIGH (ref 0–99)
LDl/HDL Ratio: 4.1 ratio — ABNORMAL HIGH (ref 0.0–3.2)
Triglycerides: 120 mg/dL (ref 0–149)
VLDL Cholesterol Cal: 24 mg/dL (ref 5–40)

## 2018-04-04 LAB — COMPREHENSIVE METABOLIC PANEL
A/G RATIO: 1.8 (ref 1.2–2.2)
ALK PHOS: 81 IU/L (ref 39–117)
ALT: 13 IU/L (ref 0–32)
AST: 18 IU/L (ref 0–40)
Albumin: 4.3 g/dL (ref 3.5–5.5)
BILIRUBIN TOTAL: 0.5 mg/dL (ref 0.0–1.2)
BUN/Creatinine Ratio: 26 — ABNORMAL HIGH (ref 9–23)
BUN: 20 mg/dL (ref 6–24)
CHLORIDE: 106 mmol/L (ref 96–106)
CO2: 22 mmol/L (ref 20–29)
Calcium: 9.3 mg/dL (ref 8.7–10.2)
Creatinine, Ser: 0.76 mg/dL (ref 0.57–1.00)
GFR calc non Af Amer: 90 mL/min/{1.73_m2} (ref >59)
GFR, EST AFRICAN AMERICAN: 104 mL/min/{1.73_m2} (ref >59)
Globulin, Total: 2.4 g/dL (ref 1.5–4.5)
Glucose: 107 mg/dL — ABNORMAL HIGH (ref 65–99)
POTASSIUM: 4.4 mmol/L (ref 3.5–5.2)
Sodium: 142 mmol/L (ref 134–144)
TOTAL PROTEIN: 6.7 g/dL (ref 6.0–8.5)

## 2018-04-04 LAB — HEMOGLOBIN A1C
Est. average glucose Bld gHb Est-mCnc: 120 mg/dL
Hgb A1c MFr Bld: 5.8 % — ABNORMAL HIGH (ref 4.8–5.6)

## 2018-04-04 LAB — T4, FREE: Free T4: 1.62 ng/dL (ref 0.82–1.77)

## 2018-04-04 LAB — T3: T3 TOTAL: 80 ng/dL (ref 71–180)

## 2018-04-04 LAB — VITAMIN D 25 HYDROXY (VIT D DEFICIENCY, FRACTURES): VIT D 25 HYDROXY: 43.1 ng/mL (ref 30.0–100.0)

## 2018-04-04 LAB — TSH: TSH: 1.04 u[IU]/mL (ref 0.450–4.500)

## 2018-04-04 LAB — INSULIN, RANDOM: INSULIN: 10.1 u[IU]/mL (ref 2.6–24.9)

## 2018-04-20 ENCOUNTER — Ambulatory Visit (INDEPENDENT_AMBULATORY_CARE_PROVIDER_SITE_OTHER): Payer: BC Managed Care – PPO | Admitting: Family Medicine

## 2018-04-20 VITALS — BP 122/82 | HR 71 | Temp 97.6°F | Ht 69.0 in | Wt 251.0 lb

## 2018-04-20 DIAGNOSIS — Z9189 Other specified personal risk factors, not elsewhere classified: Secondary | ICD-10-CM

## 2018-04-20 DIAGNOSIS — E7849 Other hyperlipidemia: Secondary | ICD-10-CM

## 2018-04-20 DIAGNOSIS — Z6837 Body mass index (BMI) 37.0-37.9, adult: Secondary | ICD-10-CM

## 2018-04-20 DIAGNOSIS — R7303 Prediabetes: Secondary | ICD-10-CM | POA: Diagnosis not present

## 2018-04-20 DIAGNOSIS — E038 Other specified hypothyroidism: Secondary | ICD-10-CM | POA: Diagnosis not present

## 2018-04-20 DIAGNOSIS — E559 Vitamin D deficiency, unspecified: Secondary | ICD-10-CM

## 2018-04-20 MED ORDER — LEVOTHYROXINE SODIUM 150 MCG PO TABS: 150.0000 ug | ORAL_TABLET | Freq: Every day | ORAL | 0 refills | Status: DC

## 2018-04-20 MED ORDER — ATORVASTATIN CALCIUM 10 MG PO TABS: 10.0000 mg | ORAL_TABLET | Freq: Every day | ORAL | 0 refills | Status: DC

## 2018-04-24 NOTE — Progress Notes (Signed)
Office: [phone]  /  Fax: [phone]   HPI:   Chief Complaint: OBESITY Christy [name] is here to discuss her progress with her obesity treatment plan. She is on the Category 2 plan and is following her eating plan approximately 75 % of the time. She states she is exercising 0 minutes 0 times per week. Christy [name] went to [place] and did a significant amount of walking. Christy [name] tried to adhere to the meal plan as much as she could at parks and found breakfast was easiest. Her weight is 251 lb (113.9 kg) today and has had a weight loss of 3 pounds over a period of 2 weeks since her last visit. She has lost 22 lbs since starting treatment with [name].  Hyperlipidemia Christy [name] has hyperlipidemia and her LDL level is at 164 (elevated since 2014). She is not on statin. Christy [name] has been trying to improve her cholesterol levels with intensive lifestyle modification including a low saturated fat diet, exercise and weight loss.  She denies any chest pain, claudication or myalgias.  At risk for cardiovascular disease Christy [name] is at a higher than average risk for cardiovascular disease due to obesity and hyperlipidemia. She currently denies any chest pain.  Hypothyroid Christy [name] has a diagnosis of hypothyroidism. Her thyroid panel was within normal limits. She is on levothyroxine. She denies hot or cold intolerance or palpitations, but does admit to ongoing fatigue.  Vitamin D deficiency Christy [name] has a diagnosis of vitamin D deficiency. She is currently taking vit D and fatigue has improved. Christy [name] denies nausea, vomiting or muscle weakness.  Pre-Diabetes Christy [name] has a diagnosis of pre-diabetes and was informed this puts her at greater risk of developing diabetes. Her insulin level is at 10.1 (improved from 14.4) and Hgb A1c is at 5.8 She is taking metformin currently and continues to work on diet and exercise to decrease risk of diabetes. She denies nausea or hypoglycemia.  ALLERGIES: No Known Allergies  MEDICATIONS: Current  Outpatient Medications on File Prior to Visit  Medication Sig Dispense Refill  . metFORMIN (GLUCOPHAGE) 500 MG tablet Take 1 tablet (500 mg total) by mouth daily with breakfast. 30 tablet 0  . Vitamin D, Ergocalciferol, (DRISDOL) 50000 units CAPS capsule Take 1 capsule (50,000 Units total) by mouth every 7 (seven) days. 4 capsule 0   No current facility-administered medications on file prior to visit.     PAST MEDICAL HISTORY: Past Medical History:  Diagnosis Date  . Back pain   . Fatigue   . Hypothyroidism   . Infertility, female     PAST SURGICAL HISTORY: No past surgical history on file.  SOCIAL HISTORY: Social History   Tobacco Use  . Smoking status: Not on file  Substance Use Topics  . Alcohol use: No    Frequency: Never  . Drug use: No    FAMILY HISTORY: Family History  Problem Relation Age of Onset  . Diabetes Mother   . Obesity Mother   . Cancer Father     ROS: Review of Systems  Constitutional: Positive for malaise/fatigue and weight loss.  Cardiovascular: Negative for chest pain, palpitations and claudication.  Gastrointestinal: Negative for nausea and vomiting.  Musculoskeletal: Negative for myalgias.       Negative for muscle weakness  Endo/Heme/Allergies:       Negative for hypoglycemia Negative for Heat or Cold Intolerance    PHYSICAL EXAM: Blood pressure 122/82, pulse 71, temperature 97.6 F (36.4 C), height  (1.753 m), weight 251 lb (113.9 kg), SpO2 99 %.  Body mass index is 37.07 kg/m. Physical Exam  Constitutional: She is oriented to person, place, and time. She appears well-developed and well-nourished.  Cardiovascular: Normal rate.  Pulmonary/Chest: Effort normal.  Musculoskeletal: Normal range of motion.  Neurological: She is oriented to person, place, and time.  Skin: Skin is warm and dry.  Psychiatric: She has a normal mood and affect. Her behavior is normal.  Vitals reviewed.   RECENT LABS AND TESTS: BMET    Component  Value Date/Time   NA 142 04/03/2018 0812   K 4.4 04/03/2018 0812   CL 106 04/03/2018 0812   CO2 22 04/03/2018 0812   GLUCOSE 107 (H) 04/03/2018 0812   BUN 20 04/03/2018 0812   CREATININE 0.76 04/03/2018 0812   CALCIUM 9.3 04/03/2018 0812   GFRNONAA 90 04/03/2018 0812   GFRAA 104 04/03/2018 0812   Lab Results  Component Value Date   HGBA1C 5.8 (H) 04/03/2018   HGBA1C 5.7 (H) 12/21/2017   Lab Results  Component Value Date   INSULIN 10.1 04/03/2018   INSULIN 14.4 12/21/2017   CBC    Component Value Date/Time   WBC 6.5 12/21/2017 1057   RBC 4.41 12/21/2017 1057   HGB 12.9 12/21/2017 1057   HCT 39.8 12/21/2017 1057   MCV 90 12/21/2017 1057   MCH 29.3 12/21/2017 1057   MCHC 32.4 12/21/2017 1057   RDW 14.7 12/21/2017 1057   LYMPHSABS 1.9 12/21/2017 1057   EOSABS 0.3 12/21/2017 1057   BASOSABS 0.0 12/21/2017 1057   Iron/TIBC/Ferritin/ %Sat No results found for: IRON, TIBC, FERRITIN, IRONPCTSAT Lipid Panel     Component Value Date/Time   CHOL 228 (H) 04/03/2018 0812   TRIG 120 04/03/2018 0812   HDL 40 04/03/2018 0812   LDLCALC 164 (H) 04/03/2018 0812   Hepatic Function Panel     Component Value Date/Time   PROT 6.7 04/03/2018 0812   ALBUMIN 4.3 04/03/2018 0812   AST 18 04/03/2018 0812   ALT 13 04/03/2018 0812   ALKPHOS 81 04/03/2018 0812   BILITOT 0.5 04/03/2018 0812      Component Value Date/Time   TSH 1.040 04/03/2018 0812   TSH 0.981 12/21/2017 1057   Results for Christy [name], Christy [name] (MRN [id number]) as of 04/24/2018 08:35  Ref. Range 04/03/2018 08:12  Vitamin D, 25-Hydroxy Latest Ref Range: 30.0 - 100.0 ng/mL 43.1   ASSESSMENT AND PLAN: Other hyperlipidemia - Plan: atorvastatin (LIPITOR) 10 MG tablet  Vitamin D deficiency  Prediabetes  Other specified hypothyroidism - Plan: levothyroxine (SYNTHROID, LEVOTHROID) 150 MCG tablet  At risk for heart disease  Class 2 severe obesity with serious comorbidity and body mass index (BMI) of 37.0 to 37.9 in adult,  unspecified obesity type (HCC)  PLAN:  Hyperlipidemia Masaye was informed of the American Heart Association Guidelines emphasizing intensive lifestyle modifications as the first line treatment for hyperlipidemia. We discussed many lifestyle modifications today in depth, and Indira will continue to work on decreasing saturated fats such as fatty red meat, butter and many fried foods. She will also increase vegetables and lean protein in her diet and continue to work on exercise and weight loss efforts. Lashaunda agrees to start Lipitor 10 mg qPM #30 with no refills and follow up as directed.  Cardiovascular risk counseling Geni was given extended (15 minutes) coronary artery disease prevention counseling today. She is 52 y.o. female and has risk factors for heart disease including obesity and hyperlipidemia. We discussed intensive lifestyle modifications today with an emphasis on specific weight loss instructions  and strategies. Pt was also informed of the importance of increasing exercise and decreasing saturated fats to help prevent heart disease.  Hypothyroid Marjarie was informed of the importance of good thyroid control to help with weight loss efforts. She was also informed that supertheraputic thyroid levels are dangerous and will not improve weight loss results. Rashon agrees to continue levothyroxine 150 mcg qAM #30 with no refills and follow up as directed.  Vitamin D Deficiency Brailey was informed that low vitamin D levels contributes to fatigue and are associated with obesity, breast, and colon cancer. She agrees to finish prescription Vit D ,000 IU every week and then can start OTC Vit D. Shaheen will follow up for routine testing of vitamin D, at least 2-3 times per year. She was informed of the risk of over-replacement of vitamin D and agrees to not increase her dose unless she discusses this with [name] first. Sacheen agrees to follow up with our clinic in 2 weeks.  Pre-Diabetes Iviona will continue  to work on weight loss, exercise, and decreasing simple carbohydrates in her diet to help decrease the risk of diabetes. We dicussed metformin including benefits and risks. She was informed that eating too many simple carbohydrates or too many calories at one sitting increases the likelihood of GI side effects. Lanae agrees to continue  metformin for now and a prescription was not written today. Nichol agreed to follow up with [name] as directed to monitor her progress.  Obesity Yamilette is currently in the action stage of change. As such, her goal is to continue with weight loss efforts She has agreed to follow the Category 2 plan Braxtyn has been instructed to work up to a goal of 150 minutes of combined cardio and strengthening exercise per week for weight loss and overall health benefits. We discussed the following Behavioral Modification Strategies today: increase H2O intake, better snacking choices, increasing lean protein intake, increasing vegetables and work on meal planning and easy cooking plans  Dejanique has agreed to follow up with our clinic in 2 weeks. She was informed of the importance of frequent follow up visits to maximize her success with intensive lifestyle modifications for her multiple health conditions.   OBESITY BEHAVIORAL INTERVENTION VISIT  Today's visit was # 8 out of 22.  Starting weight: 273 lbs Starting date: 12/21/17 Today's weight : 251 lbs Today's date: 04/20/2018 Total lbs lost to date: 22 (Patients must lose 7 lbs in the first 6 months to continue with counseling)   ASK: We discussed the diagnosis of obesity with Melburn Hake today and Khamil agreed to give [name] permission to discuss obesity behavioral modification therapy today.  ASSESS: Maicey has the diagnosis of obesity and her BMI today is 37.05 Sydnei is in the action stage of change   ADVISE: Sandia was educated on the multiple health risks of obesity as well as the benefit of weight loss to improve her health.  She was advised of the need for long term treatment and the importance of lifestyle modifications.  AGREE: Multiple dietary modification options and treatment options were discussed and  Mariell agreed to the above obesity treatment plan.  I, [place] Crane, am acting as transcriptionist for Filbert Schilder, MD  I have reviewed the above documentation for accuracy and completeness, and I agree with the above. - Debbra Riding, MD

## 2018-05-08 ENCOUNTER — Ambulatory Visit (INDEPENDENT_AMBULATORY_CARE_PROVIDER_SITE_OTHER): Payer: BC Managed Care – PPO | Admitting: Family Medicine

## 2018-05-08 VITALS — BP 119/78 | HR 70 | Temp 97.8°F | Ht 69.0 in | Wt 250.0 lb

## 2018-05-08 DIAGNOSIS — E038 Other specified hypothyroidism: Secondary | ICD-10-CM

## 2018-05-08 DIAGNOSIS — E559 Vitamin D deficiency, unspecified: Secondary | ICD-10-CM | POA: Diagnosis not present

## 2018-05-08 DIAGNOSIS — Z6837 Body mass index (BMI) 37.0-37.9, adult: Secondary | ICD-10-CM

## 2018-05-08 NOTE — Progress Notes (Signed)
Office: [phone]  /  Fax: [phone]   HPI:   Chief Complaint: OBESITY Christy [name] is here to discuss her progress with her obesity treatment plan. She is on the Category 2 plan and is following her eating plan approximately 50 % of the time. She states she is doing resistance bands for 30 minutes 3 times per week. Christy [name] is having a cough and she just finished amoxicillin. Christy [name] hasn't felt like cooking much. Her weight is 250 lb (113.4 kg) today and has had a weight loss of 1 pound over a period of 2 weeks since her last visit. She has lost 23 lbs since starting treatment with [name].  Vitamin D deficiency Christy [name] has a diagnosis of vitamin D deficiency. She is not currently taking vit D and denies nausea, vomiting or muscle weakness.  Hypothyroidism Christy [name] has a diagnosis of hypothyroidism. She is on levothyroxine. She denies hot or cold intolerance or palpitations.   ALLERGIES: No Known Allergies  MEDICATIONS: Current Outpatient Medications on File Prior to Visit  Medication Sig Dispense Refill  . atorvastatin (LIPITOR) 10 MG tablet Take 1 tablet (10 mg total) by mouth daily. 30 tablet 0  . levothyroxine (SYNTHROID, LEVOTHROID) 150 MCG tablet Take 1 tablet (150 mcg total) by mouth daily before breakfast. 30 tablet 0  . metFORMIN (GLUCOPHAGE) 500 MG tablet Take 1 tablet (500 mg total) by mouth daily with breakfast. 30 tablet 0   No current facility-administered medications on file prior to visit.     PAST MEDICAL HISTORY: Past Medical History:  Diagnosis Date  . Back pain   . Fatigue   . Hypothyroidism   . Infertility, female     PAST SURGICAL HISTORY: No past surgical history on file.  SOCIAL HISTORY: Social History   Tobacco Use  . Smoking status: Not on file  Substance Use Topics  . Alcohol use: No    Frequency: Never  . Drug use: No    FAMILY HISTORY: Family History  Problem Relation Age of Onset  . Diabetes Mother   . Obesity Mother   . Cancer Father      ROS: Review of Systems  Constitutional: Positive for weight loss.  Cardiovascular: Negative for palpitations.  Gastrointestinal: Negative for nausea and vomiting.  Musculoskeletal:       Negative for muscle weakness  Endo/Heme/Allergies:       Negative for heat or cold intolerance    PHYSICAL EXAM: Blood pressure 119/78, pulse 70, temperature 97.8 F (36.6 C), temperature source Oral, height  (1.753 m), weight 250 lb (113.4 kg), SpO2 95 %. Body mass index is 36.92 kg/m. Physical Exam  Constitutional: She is oriented to person, place, and time. She appears well-developed and well-nourished.  Cardiovascular: Normal rate.  Pulmonary/Chest: Effort normal.  Musculoskeletal: Normal range of motion.  Neurological: She is oriented to person, place, and time.  Skin: Skin is warm and dry.  Psychiatric: She has a normal mood and affect. Her behavior is normal.  Vitals reviewed.   RECENT LABS AND TESTS: BMET    Component Value Date/Time   NA 142 04/03/2018 0812   K 4.4 04/03/2018 0812   CL 106 04/03/2018 0812   CO2 22 04/03/2018 0812   GLUCOSE 107 (H) 04/03/2018 0812   BUN 20 04/03/2018 0812   CREATININE 0.76 04/03/2018 0812   CALCIUM 9.3 04/03/2018 0812   GFRNONAA 90 04/03/2018 0812   GFRAA 104 04/03/2018 0812   Lab Results  Component Value Date   HGBA1C 5.8 (H)  04/03/2018   HGBA1C 5.7 (H) 12/21/2017   Lab Results  Component Value Date   INSULIN 10.1 04/03/2018   INSULIN 14.4 12/21/2017   CBC    Component Value Date/Time   WBC 6.5 12/21/2017 1057   RBC 4.41 12/21/2017 1057   HGB 12.9 12/21/2017 1057   HCT 39.8 12/21/2017 1057   MCV 90 12/21/2017 1057   MCH 29.3 12/21/2017 1057   MCHC 32.4 12/21/2017 1057   RDW 14.7 12/21/2017 1057   LYMPHSABS 1.9 12/21/2017 1057   EOSABS 0.3 12/21/2017 1057   BASOSABS 0.0 12/21/2017 1057   Iron/TIBC/Ferritin/ %Sat No results found for: IRON, TIBC, FERRITIN, IRONPCTSAT Lipid Panel     Component Value Date/Time    CHOL 228 (H) 04/03/2018 0812   TRIG 120 04/03/2018 0812   HDL 40 04/03/2018 0812   LDLCALC 164 (H) 04/03/2018 0812   Hepatic Function Panel     Component Value Date/Time   PROT 6.7 04/03/2018 0812   ALBUMIN 4.3 04/03/2018 0812   AST 18 04/03/2018 0812   ALT 13 04/03/2018 0812   ALKPHOS 81 04/03/2018 0812   BILITOT 0.5 04/03/2018 0812      Component Value Date/Time   TSH 1.040 04/03/2018 0812   TSH 0.981 12/21/2017 1057   Results for MERRILY, TEGELER (MRN [id number]) as of 05/08/2018 11:51  Ref. Range 04/03/2018 08:12  Vitamin D, 25-Hydroxy Latest Ref Range: 30.0 - 100.0 ng/mL 43.1   ASSESSMENT AND PLAN: Vitamin D deficiency  Other specified hypothyroidism  Class 2 severe obesity with serious comorbidity and body mass index (BMI) of 37.0 to 37.9 in adult, unspecified obesity type (HCC)  PLAN:  Vitamin D Deficiency Christy [name] was informed that low vitamin D levels contributes to fatigue and are associated with obesity, breast, and colon cancer. She agrees to start to take OTC Vit D and will follow up for routine testing of vitamin D, at least 2-3 times per year. She was informed of the risk of over-replacement of vitamin D and agrees to not increase her dose unless she discusses this with [name] first. Christy [name] agrees to follow up as directed.  Hypothyroidism Kyrsten was informed of the importance of good thyroid control to help with weight loss efforts. She was also informed that supertheraputic thyroid levels are dangerous and will not improve weight loss results. Christy [name] agrees to continue levothyroxine 150 mcg daily (no refill needed) and follow up as directed.  We spent > than 50% of the 15 minute visit on the counseling as documented in the note.  Obesity Christy [name] is currently in the action stage of change. As such, her goal is to continue with weight loss efforts She has agreed to follow the Category 2 plan Christy [name] has been instructed to work up to a goal of 150 minutes of combined cardio and  strengthening exercise per week for weight loss and overall health benefits. We discussed the following Behavioral Modification Strategies today: better snacking choices, planning for success, increasing lean protein intake, increasing vegetables and work on meal planning and easy cooking plans  Mehgan has agreed to follow up with our clinic in 2 weeks. She was informed of the importance of frequent follow up visits to maximize her success with intensive lifestyle modifications for her multiple health conditions.   OBESITY BEHAVIORAL INTERVENTION VISIT  Today's visit was # 9 out of 22.  Starting weight: 273 lbs Starting date: 12/21/17 Today's weight : 250 lbs Today's date: 05/08/2018 Total lbs lost to date: 23 (Patients must lose 7  lbs in the first 6 months to continue with counseling)   ASK: We discussed the diagnosis of obesity with Melburn Hake today and Allix agreed to give [name] permission to discuss obesity behavioral modification therapy today.  ASSESS: Shivali has the diagnosis of obesity and her BMI today is 36.9 Raveena is in the action stage of change   ADVISE: Gillie was educated on the multiple health risks of obesity as well as the benefit of weight loss to improve her health. She was advised of the need for long term treatment and the importance of lifestyle modifications.  AGREE: Multiple dietary modification options and treatment options were discussed and  Seri agreed to the above obesity treatment plan.  I, [place] Crane, am acting as transcriptionist for Filbert Schilder, MD  I have reviewed the above documentation for accuracy and completeness, and I agree with the above. - Debbra Riding, MD

## 2018-06-01 ENCOUNTER — Ambulatory Visit (INDEPENDENT_AMBULATORY_CARE_PROVIDER_SITE_OTHER): Payer: BC Managed Care – PPO | Admitting: Family Medicine

## 2018-06-01 VITALS — BP 109/82 | HR 74 | Temp 98.0°F | Ht 69.0 in | Wt 250.0 lb

## 2018-06-01 DIAGNOSIS — R7303 Prediabetes: Secondary | ICD-10-CM

## 2018-06-01 DIAGNOSIS — E038 Other specified hypothyroidism: Secondary | ICD-10-CM

## 2018-06-01 DIAGNOSIS — Z6837 Body mass index (BMI) 37.0-37.9, adult: Secondary | ICD-10-CM | POA: Diagnosis not present

## 2018-06-01 NOTE — Progress Notes (Signed)
Office: [phone]  /  Fax: [phone]   HPI:   Chief Complaint: OBESITY Christy [name] is here to discuss her progress with her obesity treatment plan. She is on the Category 2 plan and is following her eating plan approximately 50 % of the time. She states she is stretching and doing resistance for 40 minutes 2-3 times per week. Christy [name] has lots of activities with end of school (works in [employer]). May have over indulged with celebration eating. Struggled with waiting 3 weeks for appointment. Her weight is 250 lb (113.4 kg) today and has not lost weight since her last visit. She has lost 23 lbs since starting treatment with us.  Pre-Diabetes Christy [name] has a diagnosis of pre-diabetes based on her elevated Hgb A1c and was informed this puts her at greater risk of developing diabetes. She notes cravings for salty crunchy carbohydrates. She is taking metformin currently and continues to work on diet and exercise to decrease risk of diabetes. She denies nausea or hypoglycemia.  Hypothyroidism Christy [name] has a diagnosis of hypothyroidism. She is on levothyroxine. She denies hot or cold intolerance, palpitations, or fatigue.  ALLERGIES: No Known Allergies  MEDICATIONS: Current Outpatient Medications on File Prior to Visit  Medication Sig Dispense Refill  . atorvastatin (LIPITOR) 10 MG tablet Take 1 tablet (10 mg total) by mouth daily. 30 tablet 0  . levothyroxine (SYNTHROID, LEVOTHROID) 150 MCG tablet Take 1 tablet (150 mcg total) by mouth daily before breakfast. 30 tablet 0  . metFORMIN (GLUCOPHAGE) 500 MG tablet Take 1 tablet (500 mg total) by mouth daily with breakfast. 30 tablet 0   No current facility-administered medications on file prior to visit.     PAST MEDICAL HISTORY: Past Medical History:  Diagnosis Date  . Back pain   . Fatigue   . Hypothyroidism   . Infertility, female     PAST SURGICAL HISTORY: No past surgical history on file.  SOCIAL HISTORY: Social History    Tobacco Use  . Smoking status: Not on file  Substance Use Topics  . Alcohol use: No    Frequency: Never  . Drug use: No    FAMILY HISTORY: Family History  Problem Relation Age of Onset  . Diabetes Mother   . Obesity Mother   . Cancer Father     ROS: Review of Systems  Constitutional: Negative for malaise/fatigue and weight loss.  Cardiovascular: Negative for palpitations.  Gastrointestinal: Negative for nausea.  Endo/Heme/Allergies:       Negative hot/cold intolerance Negative hypoglycemia    PHYSICAL EXAM: Blood pressure 109/82, pulse 74, temperature 98 F (36.7 C), temperature source Oral, height 5\' 9"  (1.753 m), weight 250 lb (113.4 kg), SpO2 97 %. Body mass index is 36.92 kg/m. Physical Exam  Constitutional: She is oriented to person, place, and time. She appears well-developed and well-nourished.  Cardiovascular: Normal rate.  Pulmonary/Chest: Effort normal.  Musculoskeletal: Normal range of motion.  Neurological: She is oriented to person, place, and time.  Skin: Skin is warm and dry.  Psychiatric: She has a normal mood and affect. Her behavior is normal.  Vitals reviewed.   RECENT LABS AND TESTS: BMET    Component Value Date/Time   NA 142 04/03/2018 0812   K 4.4 04/03/2018 0812   CL 106 04/03/2018 0812   CO2 22 04/03/2018 0812   GLUCOSE 107 (H) 04/03/2018 0812   BUN 20 04/03/2018 0812   CREATININE 0.76 04/03/2018 0812   CALCIUM 9.3 04/03/2018 0812   GFRNONAA 90 04/03/2018 [id number]  GFRAA 104 04/03/2018 0812   Lab Results  Component Value Date   HGBA1C 5.8 (H) 04/03/2018   HGBA1C 5.7 (H) 12/21/2017   Lab Results  Component Value Date   INSULIN 10.1 04/03/2018   INSULIN 14.4 12/21/2017   CBC    Component Value Date/Time   WBC 6.5 12/21/2017 1057   RBC 4.41 12/21/2017 1057   HGB 12.9 12/21/2017 1057   HCT 39.8 12/21/2017 1057   MCV 90 12/21/2017 1057   MCH 29.3 12/21/2017 1057   MCHC 32.4 12/21/2017 1057   RDW 14.7 12/21/2017 1057    LYMPHSABS 1.9 12/21/2017 1057   EOSABS 0.3 12/21/2017 1057   BASOSABS 0.0 12/21/2017 1057   Iron/TIBC/Ferritin/ %Sat No results found for: IRON, TIBC, FERRITIN, IRONPCTSAT Lipid Panel     Component Value Date/Time   CHOL 228 (H) 04/03/2018 0812   TRIG 120 04/03/2018 0812   HDL 40 04/03/2018 0812   LDLCALC 164 (H) 04/03/2018 0812   Hepatic Function Panel     Component Value Date/Time   PROT 6.7 04/03/2018 0812   ALBUMIN 4.3 04/03/2018 0812   AST 18 04/03/2018 0812   ALT 13 04/03/2018 0812   ALKPHOS 81 04/03/2018 0812   BILITOT 0.5 04/03/2018 0812      Component Value Date/Time   TSH 1.040 04/03/2018 0812   TSH 0.981 12/21/2017 1057    ASSESSMENT AND PLAN: Prediabetes  Other specified hypothyroidism  Class 2 severe obesity with serious comorbidity and body mass index (BMI) of 37.0 to 37.9 in adult, unspecified obesity type [facility])  PLAN:  Pre-Diabetes Chelan will continue to work on weight loss, exercise, and decreasing simple carbohydrates in her diet to help decrease the risk of diabetes. We dicussed metformin including benefits and risks. She was informed that eating too many simple carbohydrates or too many calories at one sitting increases the likelihood of GI side effects. Keshonna agrees to continue taking metformin 500 mg q AM, no refill needed. Xian agrees to follow up with our clinic in 2 weeks as directed to monitor her progress.  Hypothyroidism Nikkie was informed of the importance of good thyroid control to help with weight loss efforts. She was also informed that supertheraputic thyroid levels are dangerous and will not improve weight loss results. Evett agrees to continue taking levothyroxine 150 mcg PO daily and she agrees to follow up with our clinic in 2 weeks.   We spent > than 50% of the 15 minute visit on the counseling as documented in the note.  Obesity Aanshi is currently in the action stage of change. As such, her goal is to continue with weight loss  efforts She has agreed to follow the Category 2 plan Tahira has been instructed to work up to a goal of 150 minutes of combined cardio and strengthening exercise per week for weight loss and overall health benefits. We discussed the following Behavioral Modification Strategies today: increasing lean protein intake, increasing vegetables, work on meal planning and easy cooking plans, celebration eating strategies, and planning for success    Janiya has agreed to follow up with our clinic in 2 weeks. She was informed of the importance of frequent follow up visits to maximize her success with intensive lifestyle modifications for her multiple health conditions.   OBESITY BEHAVIORAL INTERVENTION VISIT  Today's visit was # 10 out of 22.  Starting weight: 273 lbs Starting date: 12/21/17 Today's weight : 250 lbs  Today's date: 06/01/2018 Total lbs lost to date: 23 (Patients must lose  7 lbs in the first 6 months to continue with counseling)   ASK: We discussed the diagnosis of obesity with Melburn Hake today and Jozalynn agreed to give [name] permission to discuss obesity behavioral modification therapy today.  ASSESS: Marili has the diagnosis of obesity and her BMI today is 36.9 Kaleigh is in the action stage of change   ADVISE: Caileigh was educated on the multiple health risks of obesity as well as the benefit of weight loss to improve her health. She was advised of the need for long term treatment and the importance of lifestyle modifications.  AGREE: Multiple dietary modification options and treatment options were discussed and  Quinnie agreed to the above obesity treatment plan.  I, Burt Knack, am acting as transcriptionist for Debbra Riding, MD  I have reviewed the above documentation for accuracy and completeness, and I agree with the above. - Debbra Riding, MD

## 2018-06-15 ENCOUNTER — Ambulatory Visit (INDEPENDENT_AMBULATORY_CARE_PROVIDER_SITE_OTHER): Payer: BC Managed Care – PPO | Admitting: Family Medicine

## 2018-06-15 VITALS — BP 108/78 | HR 79 | Temp 98.1°F | Ht 69.0 in | Wt 249.0 lb

## 2018-06-15 DIAGNOSIS — E038 Other specified hypothyroidism: Secondary | ICD-10-CM | POA: Diagnosis not present

## 2018-06-15 DIAGNOSIS — Z9189 Other specified personal risk factors, not elsewhere classified: Secondary | ICD-10-CM | POA: Diagnosis not present

## 2018-06-15 DIAGNOSIS — R7303 Prediabetes: Secondary | ICD-10-CM

## 2018-06-15 DIAGNOSIS — Z6836 Body mass index (BMI) 36.0-36.9, adult: Secondary | ICD-10-CM | POA: Diagnosis not present

## 2018-06-15 MED ORDER — LEVOTHYROXINE SODIUM 150 MCG PO TABS: 150.0000 ug | ORAL_TABLET | Freq: Every day | ORAL | 0 refills | Status: DC

## 2018-06-15 NOTE — Progress Notes (Signed)
Office: [phone]  /  Fax: [phone]   HPI:   Chief Complaint: OBESITY Christy [name] [name] here to discuss her progress with her obesity treatment plan. Christy [name] on the Category 2 plan and [name] following her eating plan approximately 50 % of the time. Christy states Christy [name] using strength bands for 40 minutes 4 times per week. Christy [name] has been trying to get used to new schedule of working 4 ten hour days, sometimes eating late.  Her weight [name] 249 lb (112.9 kg) today and has had a weight loss of 1 pound over a period of 2 weeks since her last visit. Christy has lost 24 lbs since starting treatment with [name].  Hypothyroidism Christy [name] has a diagnosis of hypothyroidism. Christy [name] on levothyroxine. Christy denies hot or cold intolerance or palpitations.  Pre-Diabetes Christy [name] has a diagnosis of pre-diabetes based on her elevated Hgb A1c and was informed this puts her at greater risk of developing diabetes. Christy denies carbohydrate cravings or hypoglycemia. Christy [name] on metformin with no GI side effects and continues to work on diet and exercise to decrease risk of diabetes.  At risk for diabetes Christy [name] [name] at higher than average risk for developing diabetes due to her obesity and pre-diabetes. Christy currently denies polyuria or polydipsia.  ALLERGIES: No Known Allergies  MEDICATIONS: Current Outpatient Medications on File Prior to Visit  Medication Sig Dispense Refill  . atorvastatin (LIPITOR) 10 MG tablet Take 1 tablet (10 mg total) by mouth daily. 30 tablet 0  . levothyroxine (SYNTHROID, LEVOTHROID) 150 MCG tablet Take 1 tablet (150 mcg total) by mouth daily before breakfast. 30 tablet 0  . metFORMIN (GLUCOPHAGE) 500 MG tablet Take 1 tablet (500 mg total) by mouth daily with breakfast. 30 tablet 0   No current facility-administered medications on file prior to visit.     PAST MEDICAL HISTORY: Past Medical History:  Diagnosis Date  . Back pain   . Fatigue   . Hypothyroidism   . Infertility, female     PAST SURGICAL  HISTORY: No past surgical history on file.  SOCIAL HISTORY: Social History   Tobacco Use  . Smoking status: Not on file  Substance Use Topics  . Alcohol use: No    Frequency: Never  . Drug use: No    FAMILY HISTORY: Family History  Problem Relation Age of Onset  . Diabetes Mother   . Obesity Mother   . Cancer Father     ROS: Review of Systems  Constitutional: Positive for weight loss.  Genitourinary: Negative for frequency.  Endo/Heme/Allergies: Negative for polydipsia.       Negative hot/cold intolerance Negative hypoglycemia    PHYSICAL EXAM: Blood pressure 108/78, pulse 79, temperature 98.1 F (36.7 C), temperature source Oral, height 5\' 9"  (1.753 m), weight 249 lb (112.9 kg), SpO2 97 %. Body mass index [name] 36.77 kg/m. Physical Exam  Constitutional: Christy [name] oriented to person, place, and time. Christy appears well-developed and well-nourished.  Cardiovascular: Normal rate.  Pulmonary/Chest: Effort normal.  Musculoskeletal: Normal range of motion.  Neurological: Christy [name] oriented to person, place, and time.  Skin: Skin [name] warm and dry.  Psychiatric: Christy has a normal mood and affect. Her behavior [name] normal.  Vitals reviewed.   RECENT LABS AND TESTS: BMET    Component Value Date/Time   NA 142 04/03/2018 0812   K 4.4 04/03/2018 0812   CL 106 04/03/2018 0812   CO2 22 04/03/2018 0812   GLUCOSE 107 (H) 04/03/2018 2956  BUN 20 04/03/2018 0812   CREATININE 0.76 04/03/2018 0812   CALCIUM 9.3 04/03/2018 0812   GFRNONAA 90 04/03/2018 0812   GFRAA 104 04/03/2018 0812   Lab Results  Component Value Date   HGBA1C 5.8 (H) 04/03/2018   HGBA1C 5.7 (H) 12/21/2017   Lab Results  Component Value Date   INSULIN 10.1 04/03/2018   INSULIN 14.4 12/21/2017   CBC    Component Value Date/Time   WBC 6.5 12/21/2017 1057   RBC 4.41 12/21/2017 1057   HGB 12.9 12/21/2017 1057   HCT 39.8 12/21/2017 1057   MCV 90 12/21/2017 1057   MCH 29.3 12/21/2017 1057   MCHC 32.4  12/21/2017 1057   RDW 14.7 12/21/2017 1057   LYMPHSABS 1.9 12/21/2017 1057   EOSABS 0.3 12/21/2017 1057   BASOSABS 0.0 12/21/2017 1057   Iron/TIBC/Ferritin/ %Sat No results found for: IRON, TIBC, FERRITIN, IRONPCTSAT Lipid Panel     Component Value Date/Time   CHOL 228 (H) 04/03/2018 0812   TRIG 120 04/03/2018 0812   HDL 40 04/03/2018 0812   LDLCALC 164 (H) 04/03/2018 0812   Hepatic Function Panel     Component Value Date/Time   PROT 6.7 04/03/2018 0812   ALBUMIN 4.3 04/03/2018 0812   AST 18 04/03/2018 0812   ALT 13 04/03/2018 0812   ALKPHOS 81 04/03/2018 0812   BILITOT 0.5 04/03/2018 0812      Component Value Date/Time   TSH 1.040 04/03/2018 0812   TSH 0.981 12/21/2017 1057    ASSESSMENT AND PLAN: Other specified hypothyroidism - Plan: levothyroxine (SYNTHROID, LEVOTHROID) 150 MCG tablet  Prediabetes  At risk for diabetes mellitus  Class 2 severe obesity with serious comorbidity and body mass index (BMI) of 36.0 to 36.9 in adult, unspecified obesity type (HCC)  PLAN:  Hypothyroidism Christy [name] was informed of the importance of good thyroid control to help with weight loss efforts. Christy was also informed that supertheraputic thyroid levels are dangerous and will not improve weight loss results. Christy [name] agrees to continue taking levothyroxine 150 mcg PO q AM #30 and we will refill for 1 month. Christy [name] agrees to follow up with our clinic in 2 weeks.  Pre-Diabetes Christy [name] will continue to work on weight loss, exercise, and decreasing simple carbohydrates in her diet to help decrease the risk of diabetes. We dicussed metformin including benefits and risks. Christy was informed that eating too many simple carbohydrates or too many calories at one sitting increases the likelihood of GI side effects. Christy [name] agrees to continue taking metformin and Christy agrees to follow up with our clinic in 2 weeks as directed to monitor her progress.  Diabetes risk counselling Christy [name] was given extended (15  minutes) diabetes prevention counseling today. Christy [name] 52 y.o. female and has risk factors for diabetes including obesity and pre-diabetes. We discussed intensive lifestyle modifications today with an emphasis on weight loss as well as increasing exercise and decreasing simple carbohydrates in her diet.  Obesity Christy [name] [name] currently in the action stage of change. As such, her goal [name] to continue with weight loss efforts Christy has agreed to follow the Category 2 plan Christy [name] has been instructed to work up to a goal of 150 minutes of combined cardio and strengthening exercise per week for weight loss and overall health benefits. We discussed the following Behavioral Modification Strategies today: increasing lean protein intake, increasing vegetables, work on meal planning and easy cooking plans, better snacking choices and planning for success   Christy [name] has agreed to follow  up with our clinic in 2 weeks. Christy was informed of the importance of frequent follow up visits to maximize her success with intensive lifestyle modifications for her multiple health conditions.   OBESITY BEHAVIORAL INTERVENTION VISIT  Today's visit was # 11 out of 22.  Starting weight: 273 lbs Starting date: 12/21/17 Today's weight : 249 lbs Today's date: 06/15/2018 Total lbs lost to date: 24 (Patients must lose 7 lbs in the first 6 months to continue with counseling)   ASK: We discussed the diagnosis of obesity with Melburn Hake today and Latausha agreed to give [name] permission to discuss obesity behavioral modification therapy today.  ASSESS: Kobie has the diagnosis of obesity and her BMI today [name] 36.75 Najat [name] in the action stage of change   ADVISE: Ladona was educated on the multiple health risks of obesity as well as the benefit of weight loss to improve her health. Christy was advised of the need for long term treatment and the importance of lifestyle modifications.  AGREE: Multiple dietary modification options and treatment  options were discussed and  Kaimana agreed to the above obesity treatment plan.  I, Burt Knack, am acting as transcriptionist for Debbra Riding, MD  I have reviewed the above documentation for accuracy and completeness, and I agree with the above. - Debbra Riding, MD

## 2018-07-04 ENCOUNTER — Ambulatory Visit (INDEPENDENT_AMBULATORY_CARE_PROVIDER_SITE_OTHER): Payer: BC Managed Care – PPO | Admitting: Family Medicine

## 2018-07-04 VITALS — BP 117/81 | HR 61 | Temp 98.1°F | Ht 69.0 in | Wt 249.0 lb

## 2018-07-04 DIAGNOSIS — E038 Other specified hypothyroidism: Secondary | ICD-10-CM

## 2018-07-04 DIAGNOSIS — R7303 Prediabetes: Secondary | ICD-10-CM | POA: Diagnosis not present

## 2018-07-04 DIAGNOSIS — Z6836 Body mass index (BMI) 36.0-36.9, adult: Secondary | ICD-10-CM

## 2018-07-04 NOTE — Progress Notes (Signed)
Office: [phone]  /  Fax: [phone]   HPI:   Chief Complaint: OBESITY Christy [name] is here to discuss her progress with her obesity treatment plan. She is on the Category 2 plan and is following her eating plan approximately 50 % of the time. She states she is walking for 40 minutes 3 times per week. Christy [name] finds that breakfast and lunch still easier than dinner. Finds prep for dinner to be a challenge still.  Her weight is 249 lb (112.9 kg) today and has not lost weight since her last visit. She has lost 24 lbs since starting treatment with us.  Hypothyroidism Christy [name] has a diagnosis of hypothyroidism. Last TSH checked on 04/03/18. She is on levothyroxine 150 mg daily. She denies hot or cold intolerance, palpitations, or fatigue.  Pre-Diabetes Christy [name] has a diagnosis of pre-diabetes based on her elevated Hgb A1c and was informed this puts her at greater risk of developing diabetes. Last Hgb A1c and insulin checked on 04/03/18. She notes carbohydrate cravings. She is taking metformin currently and continues to work on diet and exercise to decrease risk of diabetes. She denies nausea or hypoglycemia.  ALLERGIES: No Known Allergies  MEDICATIONS: Current Outpatient Medications on File Prior to Visit  Medication Sig Dispense Refill  . atorvastatin (LIPITOR) 10 MG tablet Take 1 tablet (10 mg total) by mouth daily. 30 tablet 0  . levothyroxine (SYNTHROID, LEVOTHROID) 150 MCG tablet Take 1 tablet (150 mcg total) by mouth daily before breakfast. 30 tablet 0  . metFORMIN (GLUCOPHAGE) 500 MG tablet Take 1 tablet (500 mg total) by mouth daily with breakfast. 30 tablet 0   No current facility-administered medications on file prior to visit.     PAST MEDICAL HISTORY: Past Medical History:  Diagnosis Date  . Back pain   . Fatigue   . Hypothyroidism   . Infertility, female     PAST SURGICAL HISTORY: No past surgical history on file.  SOCIAL HISTORY: Social History   Tobacco Use  . Smoking  status: Not on file  Substance Use Topics  . Alcohol use: No    Frequency: Never  . Drug use: No    FAMILY HISTORY: Family History  Problem Relation Age of Onset  . Diabetes Mother   . Obesity Mother   . Cancer Father     ROS: Review of Systems  Constitutional: Negative for malaise/fatigue and weight loss.  Cardiovascular: Negative for palpitations.  Gastrointestinal: Negative for nausea.  Endo/Heme/Allergies:       Negative hypoglycemia Negative hot/cold intolerance    PHYSICAL EXAM: Blood pressure 117/81, pulse 61, temperature 98.1 F (36.7 C), temperature source Oral, height 5\' 9"  (1.753 m), weight 249 lb (112.9 kg), SpO2 99 %. Body mass index is 36.77 kg/m. Physical Exam  Constitutional: She is oriented to person, place, and time. She appears well-developed and well-nourished.  Cardiovascular: Normal rate.  Pulmonary/Chest: Effort normal.  Musculoskeletal: Normal range of motion.  Neurological: She is oriented to person, place, and time.  Skin: Skin is warm and dry.  Psychiatric: She has a normal mood and affect. Her behavior is normal.  Vitals reviewed.   RECENT LABS AND TESTS: BMET    Component Value Date/Time   NA 142 04/03/2018 0812   K 4.4 04/03/2018 0812   CL 106 04/03/2018 0812   CO2 22 04/03/2018 0812   GLUCOSE 107 (H) 04/03/2018 0812   BUN 20 04/03/2018 0812   CREATININE 0.76 04/03/2018 0812   CALCIUM 9.3 04/03/2018 0812   GFRNONAA  90 04/03/2018 0812   GFRAA 104 04/03/2018 0812   Lab Results  Component Value Date   HGBA1C 5.8 (H) 04/03/2018   HGBA1C 5.7 (H) 12/21/2017   Lab Results  Component Value Date   INSULIN 10.1 04/03/2018   INSULIN 14.4 12/21/2017   CBC    Component Value Date/Time   WBC 6.5 12/21/2017 1057   RBC 4.41 12/21/2017 1057   HGB 12.9 12/21/2017 1057   HCT 39.8 12/21/2017 1057   MCV 90 12/21/2017 1057   MCH 29.3 12/21/2017 1057   MCHC 32.4 12/21/2017 1057   RDW 14.7 12/21/2017 1057   LYMPHSABS 1.9 12/21/2017  1057   EOSABS 0.3 12/21/2017 1057   BASOSABS 0.0 12/21/2017 1057   Iron/TIBC/Ferritin/ %Sat No results found for: IRON, TIBC, FERRITIN, IRONPCTSAT Lipid Panel     Component Value Date/Time   CHOL 228 (H) 04/03/2018 0812   TRIG 120 04/03/2018 0812   HDL 40 04/03/2018 0812   LDLCALC 164 (H) 04/03/2018 0812   Hepatic Function Panel     Component Value Date/Time   PROT 6.7 04/03/2018 0812   ALBUMIN 4.3 04/03/2018 0812   AST 18 04/03/2018 0812   ALT 13 04/03/2018 0812   ALKPHOS 81 04/03/2018 0812   BILITOT 0.5 04/03/2018 0812      Component Value Date/Time   TSH 1.040 04/03/2018 0812   TSH 0.981 12/21/2017 1057    ASSESSMENT AND PLAN: Other specified hypothyroidism  Prediabetes  Class 2 severe obesity with serious comorbidity and body mass index (BMI) of 36.0 to 36.9 in adult, unspecified obesity type (HCC)  PLAN:  Hypothyroidism Christy [name] was informed of the importance of good thyroid control to help with weight loss efforts. She was also informed that supertheraputic thyroid levels are dangerous and will not improve weight loss results. Christy [name] agrees to continue taking her medications and we will recheck labs at next visit. Christy [name] agrees to follow up with our clinic in 2 weeks.  Pre-Diabetes Christy [name] will continue to work on weight loss, exercise, and decreasing simple carbohydrates in her diet to help decrease the risk of diabetes. We dicussed metformin including benefits and risks. She was informed that eating too many simple carbohydrates or too many calories at one sitting increases the likelihood of GI side effects. Christy [name] agrees to continue her medications and we will check labs at next visit. Christy [name] agrees to follow up with our clinic in 2 weeks as directed to monitor her progress.  We spent > than 50% of the 15 minute visit on the counseling as documented in the note.  Obesity Christy [name] is currently in the action stage of change. As such, her goal is to continue with weight  loss efforts She has agreed to follow the Category 2 plan and follow the Pescatarian eating plan for dinner Christy [name] has been instructed to work up to a goal of 150 minutes of combined cardio and strengthening exercise per week for weight loss and overall health benefits. We discussed the following Behavioral Modification Strategies today: increasing lean protein intake, increasing vegetables, work on meal planning and easy cooking plans, and planning for success   Christy [name] has agreed to follow up with our clinic in 2 weeks. She was informed of the importance of frequent follow up visits to maximize her success with intensive lifestyle modifications for her multiple health conditions.   OBESITY BEHAVIORAL INTERVENTION VISIT  Today's visit was # 12 out of 22.  Starting weight: 273 lbs Starting date: 12/21/17 Today's weight : 249 lbs  Today's date: 07/04/2018 Total lbs lost to date: 24    ASK: We discussed the diagnosis of obesity with Christy [name] today and Christy [name] agreed to give [name] permission to discuss obesity behavioral modification therapy today.  ASSESS: Christy [name] has the diagnosis of obesity and her BMI today is 36.75 Christy [name] is in the action stage of change   ADVISE: Christy [name] was educated on the multiple health risks of obesity as well as the benefit of weight loss to improve her health. She was advised of the need for long term treatment and the importance of lifestyle modifications.  AGREE: Multiple dietary modification options and treatment options were discussed and  Christy [name] agreed to the above obesity treatment plan.  I, Burt Knack, am acting as transcriptionist for Debbra Riding, MD  I have reviewed the above documentation for accuracy and completeness, and I agree with the above. - Debbra Riding, MD

## 2018-07-26 ENCOUNTER — Encounter (INDEPENDENT_AMBULATORY_CARE_PROVIDER_SITE_OTHER): Payer: Self-pay

## 2018-07-26 ENCOUNTER — Ambulatory Visit (INDEPENDENT_AMBULATORY_CARE_PROVIDER_SITE_OTHER): Payer: Self-pay | Admitting: Family Medicine

## 2018-09-04 ENCOUNTER — Ambulatory Visit (INDEPENDENT_AMBULATORY_CARE_PROVIDER_SITE_OTHER): Payer: BC Managed Care – PPO | Admitting: Bariatrics

## 2018-09-04 VITALS — BP 135/83 | HR 65 | Temp 97.8°F | Ht 69.0 in | Wt 253.0 lb

## 2018-09-04 DIAGNOSIS — Z6837 Body mass index (BMI) 37.0-37.9, adult: Secondary | ICD-10-CM

## 2018-09-04 DIAGNOSIS — Z9189 Other specified personal risk factors, not elsewhere classified: Secondary | ICD-10-CM

## 2018-09-04 DIAGNOSIS — E038 Other specified hypothyroidism: Secondary | ICD-10-CM

## 2018-09-04 DIAGNOSIS — R7303 Prediabetes: Secondary | ICD-10-CM

## 2018-09-04 MED ORDER — METFORMIN HCL 500 MG PO TABS: 500.0000 mg | ORAL_TABLET | Freq: Every day | ORAL | 0 refills | Status: DC

## 2018-09-05 ENCOUNTER — Encounter (INDEPENDENT_AMBULATORY_CARE_PROVIDER_SITE_OTHER): Payer: Self-pay | Admitting: Bariatrics

## 2018-09-05 LAB — COMPREHENSIVE METABOLIC PANEL
A/G RATIO: 2 (ref 1.2–2.2)
ALT: 12 IU/L (ref 0–32)
AST: 15 IU/L (ref 0–40)
Albumin: 4.4 g/dL (ref 3.5–5.5)
Alkaline Phosphatase: 80 IU/L (ref 39–117)
BILIRUBIN TOTAL: 0.5 mg/dL (ref 0.0–1.2)
BUN/Creatinine Ratio: 23 (ref 9–23)
BUN: 18 mg/dL (ref 6–24)
CALCIUM: 9.2 mg/dL (ref 8.7–10.2)
CHLORIDE: 103 mmol/L (ref 96–106)
CO2: 22 mmol/L (ref 20–29)
Creatinine, Ser: 0.78 mg/dL (ref 0.57–1.00)
GFR calc Af Amer: 100 mL/min/{1.73_m2} (ref >59)
GFR, EST NON AFRICAN AMERICAN: 87 mL/min/{1.73_m2} (ref >59)
GLOBULIN, TOTAL: 2.2 g/dL (ref 1.5–4.5)
Glucose: 99 mg/dL (ref 65–99)
POTASSIUM: 4.4 mmol/L (ref 3.5–5.2)
Sodium: 140 mmol/L (ref 134–144)
Total Protein: 6.6 g/dL (ref 6.0–8.5)

## 2018-09-05 LAB — LIPID PANEL WITH LDL/HDL RATIO
Cholesterol, Total: 241 mg/dL — ABNORMAL HIGH (ref 100–199)
HDL: 48 mg/dL (ref >39)
LDL Calculated: 168 mg/dL — ABNORMAL HIGH (ref 0–99)
LDL/HDL RATIO: 3.5 ratio — AB (ref 0.0–3.2)
Triglycerides: 124 mg/dL (ref 0–149)
VLDL Cholesterol Cal: 25 mg/dL (ref 5–40)

## 2018-09-05 LAB — HEMOGLOBIN A1C
ESTIMATED AVERAGE GLUCOSE: 111 mg/dL
Hgb A1c MFr Bld: 5.5 % (ref 4.8–5.6)

## 2018-09-05 LAB — T3: T3 TOTAL: 87 ng/dL (ref 71–180)

## 2018-09-05 LAB — TSH: TSH: 1.87 u[IU]/mL (ref 0.450–4.500)

## 2018-09-05 LAB — T4, FREE: FREE T4: 1.69 ng/dL (ref 0.82–1.77)

## 2018-09-05 LAB — INSULIN, RANDOM: INSULIN: 5.7 u[IU]/mL (ref 2.6–24.9)

## 2018-09-05 LAB — VITAMIN D 25 HYDROXY (VIT D DEFICIENCY, FRACTURES): VIT D 25 HYDROXY: 25.9 ng/mL — AB (ref 30.0–100.0)

## 2018-09-05 NOTE — Progress Notes (Signed)
Office: [phone]  /  Fax: [phone]   HPI:   Chief Complaint: OBESITY Christy [name] is here to discuss her progress with her obesity treatment plan. She is on the Category 2 plan and is following her eating plan approximately 40 % of the time. She states she is exercising 0 minutes 0 times per week. Christy [name] is having increased stress and back pain. She ran out of metformin. Her weight is 253 lb (114.8 kg) today and has not lost weight since her last visit. She has lost 20 lbs since starting treatment with [name].  Pre-Diabetes Christy [name] has a diagnosis of prediabetes based on her elevated Hgb A1c and was informed that this puts her at greater risk of developing diabetes. Her last A1c was 5.8 and Insulin was 10.1 respectively.  She is taking metformin currently with no side effects. She continues to work on diet and exercise to decrease risk of diabetes. She denies polydipsia or polyphagia.  At risk for diabetes Christy [name] is at higher than average risk for developing diabetes due to her pre-diabetes and obesity. She currently denies polyuria or polydipsia.  Hypothyroid Christy [name] has a diagnosis of hypothyroidism. She is on Synthroid. She denies fatigue, hot or cold intolerance, or palpitations.  ALLERGIES: No Known Allergies  MEDICATIONS: Current Outpatient Medications on File Prior to Visit  Medication Sig Dispense Refill  . atorvastatin (LIPITOR) 10 MG tablet Take 1 tablet (10 mg total) by mouth daily. 30 tablet 0  . levothyroxine (SYNTHROID, LEVOTHROID) 150 MCG tablet Take 1 tablet (150 mcg total) by mouth daily before breakfast. 30 tablet 0   No current facility-administered medications on file prior to visit.     PAST MEDICAL HISTORY: Past Medical History:  Diagnosis Date  . Back pain   . Fatigue   . Hypothyroidism   . Infertility, female     PAST SURGICAL HISTORY: No past surgical history on file.  SOCIAL HISTORY: Social History   Tobacco Use  . Smoking status: Not on file    Substance Use Topics  . Alcohol use: No    Frequency: Never  . Drug use: No    FAMILY HISTORY: Family History  Problem Relation Age of Onset  . Diabetes Mother   . Obesity Mother   . Cancer Father     ROS: Review of Systems  Constitutional: Negative for malaise/fatigue and weight loss.  Cardiovascular: Negative for palpitations.  Genitourinary:       Negative for polyuria.  Endo/Heme/Allergies: Negative for polydipsia.       Negative for cold intolerance. Negative for hot intolerance.    PHYSICAL EXAM: Blood pressure 135/83, pulse 65, temperature 97.8 F (36.6 C), temperature source Oral, height 5\' 9"  (1.753 m), weight 253 lb (114.8 kg), SpO2 99 %. Body mass index is 37.36 kg/m. Physical Exam  Constitutional: She is oriented to person, place, and time. She appears well-developed and well-nourished.  Cardiovascular: Normal rate.  Pulmonary/Chest: Effort normal.  Musculoskeletal: Normal range of motion.  Neurological: She is oriented to person, place, and time.  Skin: Skin is warm and dry.  Psychiatric: She has a normal mood and affect. Her behavior is normal.  Vitals reviewed.   RECENT LABS AND TESTS: BMET    Component Value Date/Time   NA 140 09/04/2018 0802   K 4.4 09/04/2018 0802   CL 103 09/04/2018 0802   CO2 22 09/04/2018 0802   GLUCOSE 99 09/04/2018 0802   BUN 18 09/04/2018 0802   CREATININE 0.78 09/04/2018 0802   CALCIUM  9.2 09/04/2018 0802   GFRNONAA 87 09/04/2018 0802   GFRAA 100 09/04/2018 0802   Lab Results  Component Value Date   HGBA1C 5.5 09/04/2018   HGBA1C 5.8 (H) 04/03/2018   HGBA1C 5.7 (H) 12/21/2017   Lab Results  Component Value Date   INSULIN 5.7 09/04/2018   INSULIN 10.1 04/03/2018   INSULIN 14.4 12/21/2017   CBC    Component Value Date/Time   WBC 6.5 12/21/2017 1057   RBC 4.41 12/21/2017 1057   HGB 12.9 12/21/2017 1057   HCT 39.8 12/21/2017 1057   MCV 90 12/21/2017 1057   MCH 29.3 12/21/2017 1057   MCHC 32.4  12/21/2017 1057   RDW 14.7 12/21/2017 1057   LYMPHSABS 1.9 12/21/2017 1057   EOSABS 0.3 12/21/2017 1057   BASOSABS 0.0 12/21/2017 1057   Iron/TIBC/Ferritin/ %Sat No results found for: IRON, TIBC, FERRITIN, IRONPCTSAT Lipid Panel     Component Value Date/Time   CHOL 241 (H) 09/04/2018 0802   TRIG 124 09/04/2018 0802   HDL 48 09/04/2018 0802   LDLCALC 168 (H) 09/04/2018 0802   Hepatic Function Panel     Component Value Date/Time   PROT 6.6 09/04/2018 0802   ALBUMIN 4.4 09/04/2018 0802   AST 15 09/04/2018 0802   ALT 12 09/04/2018 0802   ALKPHOS 80 09/04/2018 0802   BILITOT 0.5 09/04/2018 0802      Component Value Date/Time   TSH 1.870 09/04/2018 0802   TSH 1.040 04/03/2018 0812   TSH 0.981 12/21/2017 1057   Results for MEADOW, ABRAMO (MRN [id number]) as of 09/05/2018 14:00  Ref. Range 09/04/2018 08:02  Vitamin D, 25-Hydroxy Latest Ref Range: 30.0 - 100.0 ng/mL 25.9 (L)    ASSESSMENT AND PLAN: Prediabetes - Plan: Comprehensive metabolic panel, Hemoglobin A1c, Insulin, random, Lipid Panel With LDL/HDL Ratio, VITAMIN D 25 Hydroxy (Vit-D Deficiency, Fractures), metFORMIN (GLUCOPHAGE) 500 MG tablet  Other specified hypothyroidism - Plan: T3, T4, free, TSH  At risk for diabetes mellitus  Class 2 severe obesity with serious comorbidity and body mass index (BMI) of 37.0 to 37.9 in adult, unspecified obesity type Christy [name])  PLAN:  Pre-Diabetes Christy [name] will continue to work on weight loss, exercise, and decreasing simple carbohydrates in her diet to help decrease the risk of diabetes. She was informed that eating too many simple carbohydrates or too many calories at one sitting increases the likelihood of GI side effects. Christy [name] agrees to continue metformin 500mg , 1 tablet po with breakfast #30 with no refills and a prescription was written today. Christy [name] agreed to follow up with [name] as directed to monitor her progress in 2 weeks.  Diabetes risk counseling Christy [name] was given extended (15  minutes) diabetes prevention counseling today. She is 52 y.o. female and has risk factors for diabetes including pre-diabetes and obesity. We discussed intensive lifestyle modifications today with an emphasis on weight loss as well as increasing exercise and decreasing simple carbohydrates in her diet.  Hypothyroid Christy [name] was informed of the importance of good thyroid control to help with weight loss efforts. She was also informed that supertherapeutic thyroid levels are dangerous and will not improve weight loss results. She agrees to continue her medication for hypothyroidism and we will check a thyroid panel.  Obesity Christy [name] is currently in the action stage of change. As such, her goal is to continue with weight loss efforts. She has agreed to stay on the Category 2 plan and decrease carbohydrates. She was given the restaurants "Places to Eat" information. [place] has been  instructed to work up to a goal of 150 minutes of combined cardio and strengthening exercise per week for weight loss and overall health benefits. We discussed the following Behavioral Modification Strategies today: increasing lean protein intake,  increasing vegetables, increase H2O intake, and no skipping meals.  Christy [name] has agreed to follow up with our clinic in 2 weeks. She was informed of the importance of frequent follow up visits to maximize her success with intensive lifestyle modifications for her multiple health conditions.   OBESITY BEHAVIORAL INTERVENTION VISIT  Today's visit was # 13   Starting weight: 273 lbs Starting date: 12/21/17 Today's weight : Weight: 253 lb (114.8 kg)  Today's date: 09/04/2018 Total lbs lost to date: 20 I discussed the following behavioral interventions at this visit.   ASK: We discussed the diagnosis of obesity with Melburn HakeLaura Fritzsche today and Christy [name] agreed to give us permission to discuss obesity behavioral modification therapy today.  ASSESS: Christy [name] has the diagnosis of obesity and her  BMI today is 37.34.  Christy [name] is in the action stage of change.   ADVISE: Christy [name] was educated on the multiple health risks of obesity as well as the benefit of weight loss to improve her health. She was advised of the need for long term treatment and the importance of lifestyle modifications to improve her current health and to decrease her risk of future health problems.  AGREE: Multiple dietary modification options and treatment options were discussed and Christy [name] agreed to follow the recommendations documented in the above note.  ARRANGE: Christy [name] was educated on the importance of frequent visits to treat obesity as outlined per CMS and USPSTF guidelines and agreed to schedule her next follow up appointment today.  I, Kirke Corinara Soares, am acting as Energy managertranscriptionist for [employer] [place] Corporationngel A. Manson PasseyBrown, MD  I have reviewed the above documentation for accuracy and completeness, and I agree with the above. -Corinna CapraAngel Brown, DO

## 2018-09-20 ENCOUNTER — Ambulatory Visit (INDEPENDENT_AMBULATORY_CARE_PROVIDER_SITE_OTHER): Payer: BC Managed Care – PPO | Admitting: Bariatrics

## 2018-09-26 ENCOUNTER — Ambulatory Visit (INDEPENDENT_AMBULATORY_CARE_PROVIDER_SITE_OTHER): Payer: BC Managed Care – PPO | Admitting: Bariatrics

## 2018-10-05 ENCOUNTER — Ambulatory Visit (INDEPENDENT_AMBULATORY_CARE_PROVIDER_SITE_OTHER): Payer: BC Managed Care – PPO | Admitting: Bariatrics

## 2018-10-12 ENCOUNTER — Ambulatory Visit (INDEPENDENT_AMBULATORY_CARE_PROVIDER_SITE_OTHER): Payer: BC Managed Care – PPO | Admitting: Bariatrics

## 2018-10-12 VITALS — BP 120/82 | HR 62 | Temp 97.7°F | Ht 69.0 in | Wt 253.0 lb

## 2018-10-12 DIAGNOSIS — R7303 Prediabetes: Secondary | ICD-10-CM | POA: Diagnosis not present

## 2018-10-12 DIAGNOSIS — E7849 Other hyperlipidemia: Secondary | ICD-10-CM

## 2018-10-12 DIAGNOSIS — Z9189 Other specified personal risk factors, not elsewhere classified: Secondary | ICD-10-CM | POA: Diagnosis not present

## 2018-10-12 DIAGNOSIS — Z6837 Body mass index (BMI) 37.0-37.9, adult: Secondary | ICD-10-CM

## 2018-10-12 MED ORDER — ATORVASTATIN CALCIUM 10 MG PO TABS: 10.0000 mg | ORAL_TABLET | Freq: Every day | ORAL | 0 refills | Status: DC

## 2018-10-12 MED ORDER — METFORMIN HCL 500 MG PO TABS: 500.0000 mg | ORAL_TABLET | Freq: Two times a day (BID) | ORAL | 0 refills | Status: DC

## 2018-10-16 NOTE — Progress Notes (Signed)
Office: [phone]  /  Fax: [phone]   HPI:   Chief Complaint: OBESITY Christy [name] is here to discuss her progress with her obesity treatment plan. She is on the  follow the Category 2 plan and is following her eating plan approximately 50 % of the time. She states she is exercising by doing cardio and weight lifting for 50 minutes 3 times per week. Christy [name] is currently struggling with having more celebrations to attend to. She is going to the [facility] for exercise classes ([employer]) 3-4 times a week. Christy [name] Kitchen  Her weight is 253 lb (114.8 kg) today and has not lost weight since her last visit. She has lost 20 lbs since starting treatment with [name].  Pre-Diabetes Christy [name] has a diagnosis of prediabetes based on her elevated HgA1c of 5.8 and fasting Insulin level of 10.1 and was informed this puts her at greater risk of developing diabetes. She is taking metformin currently and continues to work on diet and exercise to decrease risk of diabetes. She denies nausea or hypoglycemia.  Hyperlipidemia Christy [name] has hyperlipidemia and has been trying to improve her cholesterol levels with intensive lifestyle modification including a low saturated fat diet, exercise and weight loss. Her last triglyceride level was 241 and last LDL was 168. She denies any chest pain, claudication or myalgias. She has been prescribed Atorvastatin in the past but is not currently taking.   At risk for cardiovascular disease Christy [name] is at a higher than average risk for cardiovascular disease due to obesity. She currently denies any chest pain.  ALLERGIES: No Known Allergies  MEDICATIONS: Current Outpatient Medications on File Prior to Visit  Medication Sig Dispense Refill  . levothyroxine (SYNTHROID, LEVOTHROID) 150 MCG tablet Take 1 tablet (150 mcg total) by mouth daily before breakfast. 30 tablet 0   No current facility-administered medications on file prior to visit.     PAST MEDICAL HISTORY: Past Medical History:  Diagnosis Date    . Back pain   . Fatigue   . Hypothyroidism   . Infertility, female     PAST SURGICAL HISTORY: No past surgical history on file.  SOCIAL HISTORY: Social History   Tobacco Use  . Smoking status: Not on file  Substance Use Topics  . Alcohol use: No    Frequency: Never  . Drug use: No    FAMILY HISTORY: Family History  Problem Relation Age of Onset  . Diabetes Mother   . Obesity Mother   . Cancer Father     ROS: Review of Systems  Constitutional: Negative for weight loss.  Cardiovascular: Negative for chest pain and claudication.  Gastrointestinal: Negative for nausea.  Musculoskeletal: Negative for myalgias.  Endo/Heme/Allergies:       Negative for hypoglycemia    PHYSICAL EXAM: Pulse 62, temperature 97.7 F (36.5 C), temperature source Oral, height 5\' 9"  (1.753 m), weight 253 lb (114.8 kg), SpO2 98 %. Body mass index is 37.36 kg/m. Physical Exam  Constitutional: She is oriented to person, place, and time. She appears well-developed and well-nourished.  HENT:  Head: Normocephalic.  Neck: Normal range of motion.  Cardiovascular: Normal rate.  Pulmonary/Chest: Effort normal.  Musculoskeletal: Normal range of motion.  Neurological: She is alert and oriented to person, place, and time.  Skin: Skin is warm and dry.  Psychiatric: She has a normal mood and affect. Her behavior is normal.  Vitals reviewed.   RECENT LABS AND TESTS: BMET    Component Value Date/Time   NA 140 09/04/2018 0802  K 4.4 09/04/2018 0802   CL 103 09/04/2018 0802   CO2 22 09/04/2018 0802   GLUCOSE 99 09/04/2018 0802   BUN 18 09/04/2018 0802   CREATININE 0.78 09/04/2018 0802   CALCIUM 9.2 09/04/2018 0802   GFRNONAA 87 09/04/2018 0802   GFRAA 100 09/04/2018 0802   Lab Results  Component Value Date   HGBA1C 5.5 09/04/2018   HGBA1C 5.8 (H) 04/03/2018   HGBA1C 5.7 (H) 12/21/2017   Lab Results  Component Value Date   INSULIN 5.7 09/04/2018   INSULIN 10.1 04/03/2018   INSULIN  14.4 12/21/2017   CBC    Component Value Date/Time   WBC 6.5 12/21/2017 1057   RBC 4.41 12/21/2017 1057   HGB 12.9 12/21/2017 1057   HCT 39.8 12/21/2017 1057   MCV 90 12/21/2017 1057   MCH 29.3 12/21/2017 1057   MCHC 32.4 12/21/2017 1057   RDW 14.7 12/21/2017 1057   LYMPHSABS 1.9 12/21/2017 1057   EOSABS 0.3 12/21/2017 1057   BASOSABS 0.0 12/21/2017 1057   Iron/TIBC/Ferritin/ %Sat No results found for: IRON, TIBC, FERRITIN, IRONPCTSAT Lipid Panel     Component Value Date/Time   CHOL 241 (H) 09/04/2018 0802   TRIG 124 09/04/2018 0802   HDL 48 09/04/2018 0802   LDLCALC 168 (H) 09/04/2018 0802   Hepatic Function Panel     Component Value Date/Time   PROT 6.6 09/04/2018 0802   ALBUMIN 4.4 09/04/2018 0802   AST 15 09/04/2018 0802   ALT 12 09/04/2018 0802   ALKPHOS 80 09/04/2018 0802   BILITOT 0.5 09/04/2018 0802      Component Value Date/Time   TSH 1.870 09/04/2018 0802   TSH 1.040 04/03/2018 0812   TSH 0.981 12/21/2017 1057    ASSESSMENT AND PLAN: Prediabetes - Plan: metFORMIN (GLUCOPHAGE) 500 MG tablet  Other hyperlipidemia - Plan: atorvastatin (LIPITOR) 10 MG tablet  At risk for heart disease  Class 2 severe obesity with serious comorbidity and body mass index (BMI) of 37.0 to 37.9 in adult, unspecified obesity type (HCC)  PLAN: Pre-Diabetes Christy [name] will continue to work on weight loss, exercise, and decreasing simple carbohydrates in her diet to help decrease the risk of diabetes. We dicussed metformin including benefits and risks. She was informed that eating too many simple carbohydrates or too many calories at one sitting increases the likelihood of GI side effects. Christy [name] agrees to continue to take Metformin 500 mg BID #60 with no refills. Christy [name] agreed to follow up with [name] as directed to monitor her progress.  Hyperlipidemia Christy [name] was informed of the American Heart Association Guidelines emphasizing intensive lifestyle modifications as the first line  treatment for hyperlipidemia. We discussed many lifestyle modifications today in depth, and Christy [name] will continue to work on decreasing saturated fats such as fatty red meat, butter and many fried foods. She will also increase vegetables and lean protein in her diet and continue to work on exercise and weight loss efforts. She agrees to start Atorvastatin called pharmacy to ensure she did not have a prescription already on file. Prescription sent for Atorvastatin 10 mg qhs #30 with no refills. Agrees to follow up with our clinic as directed.   Cardiovascular risk counseling Christy [name] was given extended (15 minutes) coronary artery disease prevention counseling today. She is 51 y.o. female and has risk factors for heart disease including obesity. We discussed intensive lifestyle modifications today with an emphasis on specific weight loss instructions and strategies. Pt was also informed of the importance of increasing exercise  and decreasing saturated fats to help prevent heart disease.  Obesity Christy [name] is currently in the action stage of change. As such, her goal is to continue with weight loss efforts She has agreed to follow the Category 2 plan Syrina has been instructed to work up to a goal of 150 minutes of combined cardio and strengthening exercise per week for weight loss and overall health benefits. We discussed the following Behavioral Modification Strategies today: increasing lean protein intake, decreasing simple carbohydrates , increasing vegetables, decrease eating out, increasing water intake, no skipping meals, and work on meal planning and easy cooking plans.    Kristeen has agreed to follow up with our clinic in 2 weeks. She was informed of the importance of frequent follow up visits to maximize her success with intensive lifestyle modifications for her multiple health conditions.   OBESITY BEHAVIORAL INTERVENTION VISIT  Today's visit was # 14   Starting weight: 273 lb Starting date:  12/21/17 Today's weight : 253 lb Today's date: 10/12/18 Total lbs lost to date: 20 lb    ASK: We discussed the diagnosis of obesity with Melburn Hake today and Kadesha agreed to give [name] permission to discuss obesity behavioral modification therapy today.  ASSESS: Markitta has the diagnosis of obesity and her BMI today is 37.34 Rayma is in the action stage of change   ADVISE: Luvina was educated on the multiple health risks of obesity as well as the benefit of weight loss to improve her health. She was advised of the need for long term treatment and the importance of lifestyle modifications to improve her current health and to decrease her risk of future health problems.  AGREE: Multiple dietary modification options and treatment options were discussed and  Roshaunda agreed to follow the recommendations documented in the above note.  ARRANGE: Shantea was educated on the importance of frequent visits to treat obesity as outlined per CMS and USPSTF guidelines and agreed to schedule her next follow up appointment today.  Otis Peak, am acting as transcriptionist for [employer], DO   I have reviewed the above documentation for accuracy and completeness, and I agree with the above. -Corinna Capra, DO

## 2018-10-17 DIAGNOSIS — Z6839 Body mass index (BMI) 39.0-39.9, adult: Secondary | ICD-10-CM | POA: Insufficient documentation

## 2018-10-26 ENCOUNTER — Ambulatory Visit (INDEPENDENT_AMBULATORY_CARE_PROVIDER_SITE_OTHER): Payer: BC Managed Care – PPO | Admitting: Family Medicine

## 2018-11-01 ENCOUNTER — Ambulatory Visit (INDEPENDENT_AMBULATORY_CARE_PROVIDER_SITE_OTHER): Payer: BC Managed Care – PPO | Admitting: Family Medicine

## 2018-11-09 ENCOUNTER — Ambulatory Visit (INDEPENDENT_AMBULATORY_CARE_PROVIDER_SITE_OTHER): Payer: BC Managed Care – PPO | Admitting: Family Medicine

## 2018-11-09 ENCOUNTER — Encounter (INDEPENDENT_AMBULATORY_CARE_PROVIDER_SITE_OTHER): Payer: Self-pay | Admitting: Family Medicine

## 2018-11-09 VITALS — BP 115/78 | HR 63 | Temp 97.8°F | Ht 69.0 in | Wt 256.0 lb

## 2018-11-09 DIAGNOSIS — Z6837 Body mass index (BMI) 37.0-37.9, adult: Secondary | ICD-10-CM

## 2018-11-09 DIAGNOSIS — E559 Vitamin D deficiency, unspecified: Secondary | ICD-10-CM | POA: Diagnosis not present

## 2018-11-09 DIAGNOSIS — R7303 Prediabetes: Secondary | ICD-10-CM | POA: Diagnosis not present

## 2018-11-09 DIAGNOSIS — Z9189 Other specified personal risk factors, not elsewhere classified: Secondary | ICD-10-CM | POA: Diagnosis not present

## 2018-11-09 DIAGNOSIS — F3289 Other specified depressive episodes: Secondary | ICD-10-CM | POA: Diagnosis not present

## 2018-11-09 MED ORDER — VITAMIN D (ERGOCALCIFEROL) 1.25 MG (50000 UNIT) PO CAPS: 50000.0000 [IU] | ORAL_CAPSULE | ORAL | 0 refills | Status: DC

## 2018-11-09 MED ORDER — BUPROPION HCL ER (SR) 150 MG PO TB12: 150.0000 mg | ORAL_TABLET | Freq: Every day | ORAL | 0 refills | Status: DC

## 2018-11-14 NOTE — Progress Notes (Signed)
Office: [phone]  /  Fax: [phone]   HPI:   Chief Complaint: OBESITY Christy [name] is here to discuss her progress with her obesity treatment plan. She is following the Category 2 plan and is following her eating plan approximately 40 % of the time. She states she is doing aerobics 50 minutes 3 times per week. Christy [name] started doing aerobics 4 weeks ago. She struggles at times to make her food. She cooks for her husband and her mother in Social [employer]. She does state that she is getting in all of her protein. Marland Kitchen  Her weight is 256 lb (116.1 kg) today and has had a weight gain of 3 lbs since her last visit. She has lost 20 lbs since starting treatment with [name].  Pre-Diabetes Christy [name] has a diagnosis of prediabetes based on her elevated HgA1c and was informed this puts her at greater risk of developing diabetes. She is taking metformin currently, it was increased to BID at her last visit but caused stomach upset so she went back to taking it qd. She continues to work on diet and exercise to decrease risk of diabetes. She denies nausea or hypoglycemia and polyphagia. She is still having cravings.  Vitamin D deficiency Christy [name] has a diagnosis of vitamin D deficiency. She is not currently taking vit D and denies nausea, vomiting or muscle weakness.  At risk for osteopenia and osteoporosis Christy [name] is at higher risk of osteopenia and osteoporosis due to vitamin D deficiency.   Depression with emotional eating behaviors Christy [name] is struggling with emotional eating and using food for comfort to the extent that it is negatively impacting her health. She often snacks when she is not hungry. Christy [name] sometimes feels she is out of control and then feels guilty that she made poor food choices. Christy [name] said that she is snacking in the afternoons and feels that she eats for reasons other than physical hunger. She has been working on behavior modification techniques to help reduce her emotional eating and has been somewhat successful. She  shows no sign of suicidal or homicidal ideations.  Depression screen PHQ 2/9 12/21/2017  Decreased Interest 1  Down, Depressed, Hopeless 1  PHQ - 2 Score 2  Altered sleeping 1  Tired, decreased energy 3  Change in appetite 2  Feeling bad or failure about yourself  2  Trouble concentrating 1  Moving slowly or fidgety/restless 1  Suicidal thoughts 0  PHQ-9 Score 12  Difficult doing work/chores Not difficult at all     ALLERGIES: No Known Allergies  MEDICATIONS: Current Outpatient Medications on File Prior to Visit  Medication Sig Dispense Refill  . atorvastatin (LIPITOR) 10 MG tablet Take 1 tablet (10 mg total) by mouth daily. 30 tablet 0  . levothyroxine (SYNTHROID, LEVOTHROID) 150 MCG tablet Take 1 tablet (150 mcg total) by mouth daily before breakfast. 30 tablet 0  . metFORMIN (GLUCOPHAGE) 500 MG tablet Take 1 tablet (500 mg total) by mouth 2 (two) times daily with a meal. 60 tablet 0   No current facility-administered medications on file prior to visit.     PAST MEDICAL HISTORY: Past Medical History:  Diagnosis Date  . Back pain   . Fatigue   . Hypothyroidism   . Infertility, female     PAST SURGICAL HISTORY: No past surgical history on file.  SOCIAL HISTORY: Social History   Tobacco Use  . Smoking status: Never Smoker  . Smokeless tobacco: Never Used  Substance Use Topics  . Alcohol use: No  Frequency: Never  . Drug use: No    FAMILY HISTORY: Family History  Problem Relation Age of Onset  . Diabetes Mother   . Obesity Mother   . Cancer Father     ROS: Review of Systems  Constitutional: Positive for malaise/fatigue. Negative for weight loss.  Gastrointestinal: Negative for nausea and vomiting.  Musculoskeletal:       Negative for muscle weakness  Endo/Heme/Allergies:       Negative for hypoglycemia Negative for polyphagia   Psychiatric/Behavioral: Positive for depression. Negative for suicidal ideas.       Negative for homicidal ideations     PHYSICAL EXAM: Blood pressure 115/78, pulse 63, temperature 97.8 F (36.6 C), temperature source Oral, height 5\' 9"  (1.753 m), weight 256 lb (116.1 kg), SpO2 98 %. Body mass index is 37.8 kg/m. Physical Exam  Constitutional: She is oriented to person, place, and time. She appears well-developed and well-nourished.  Cardiovascular: Normal rate.  Pulmonary/Chest: Effort normal.  Musculoskeletal: Normal range of motion.  Neurological: She is alert and oriented to person, place, and time.  Skin: Skin is warm and dry.  Psychiatric: She has a normal mood and affect. Her behavior is normal.  Vitals reviewed.   RECENT LABS AND TESTS: BMET    Component Value Date/Time   NA 140 09/04/2018 0802   K 4.4 09/04/2018 0802   CL 103 09/04/2018 0802   CO2 22 09/04/2018 0802   GLUCOSE 99 09/04/2018 0802   BUN 18 09/04/2018 0802   CREATININE 0.78 09/04/2018 0802   CALCIUM 9.2 09/04/2018 0802   GFRNONAA 87 09/04/2018 0802   GFRAA 100 09/04/2018 0802   Lab Results  Component Value Date   HGBA1C 5.5 09/04/2018   HGBA1C 5.8 (H) 04/03/2018   HGBA1C 5.7 (H) 12/21/2017   Lab Results  Component Value Date   INSULIN 5.7 09/04/2018   INSULIN 10.1 04/03/2018   INSULIN 14.4 12/21/2017   CBC    Component Value Date/Time   WBC 6.5 12/21/2017 1057   RBC 4.41 12/21/2017 1057   HGB 12.9 12/21/2017 1057   HCT 39.8 12/21/2017 1057   MCV 90 12/21/2017 1057   MCH 29.3 12/21/2017 1057   MCHC 32.4 12/21/2017 1057   RDW 14.7 12/21/2017 1057   LYMPHSABS 1.9 12/21/2017 1057   EOSABS 0.3 12/21/2017 1057   BASOSABS 0.0 12/21/2017 1057   Iron/TIBC/Ferritin/ %Sat No results found for: IRON, TIBC, FERRITIN, IRONPCTSAT Lipid Panel     Component Value Date/Time   CHOL 241 (H) 09/04/2018 0802   TRIG 124 09/04/2018 0802   HDL 48 09/04/2018 0802   LDLCALC 168 (H) 09/04/2018 0802   Hepatic Function Panel     Component Value Date/Time   PROT 6.6 09/04/2018 0802   ALBUMIN 4.4 09/04/2018 0802    AST 15 09/04/2018 0802   ALT 12 09/04/2018 0802   ALKPHOS 80 09/04/2018 0802   BILITOT 0.5 09/04/2018 0802      Component Value Date/Time   TSH 1.870 09/04/2018 0802   TSH 1.040 04/03/2018 0812   TSH 0.981 12/21/2017 1057  Results for Christy HakeGERTON, Madalene (MRN [id number]) as of 11/14/2018 17:35  Ref. Range 12/21/2017 10:57  Vitamin D, 25-Hydroxy Latest Ref Range: 30.0 - 100.0 ng/mL 18.6 (L)    ASSESSMENT AND PLAN: Prediabetes  Vitamin D deficiency - Plan: Vitamin D, Ergocalciferol, (DRISDOL) 1.25 MG (50000 UT) CAPS capsule  Other depression - with emotional eating - Plan: buPROPion (WELLBUTRIN SR) 150 MG 12 hr tablet  At risk for osteoporosis  Class 2 severe obesity with serious comorbidity and body mass index (BMI) of 37.0 to 37.9 in adult, unspecified obesity type [facility])  PLAN: Pre-Diabetes Ytzel will continue to work on weight loss, exercise, and decreasing simple carbohydrates in her diet to help decrease the risk of diabetes. We dicussed metformin including benefits and risks. She was informed that eating too many simple carbohydrates or too many calories at one sitting increases the likelihood of GI side effects. Christy [name] agrees to continue taking Metformin one daily rather than twice daily because of GI upset with twice daily.  Christy [name] agrees to follow up in our office in 3 weeks.  Vitamin D Deficiency Christy [name] was informed that low vitamin D levels contributes to fatigue and are associated with obesity, breast, and colon cancer. She agrees to start taking prescription Vit D @50 ,000 IU every week #4 with no refills. She will follow up for routine testing of vitamin D, at least 2-3 times per year. She was informed of the risk of over-replacement of vitamin D and agrees to not increase her dose unless she discusses this with [name] first. Christy [name] agrees to follow up in our office in 3 weeks.  At risk for osteopenia and osteoporosis Christy [name] was given extended  (15 minutes) osteoporosis prevention  counseling today. Christy [name] is at risk for osteopenia and osteoporosis due to her vitamin D deficiency. She was encouraged to take her vitamin D and follow her higher calcium diet and increase strengthening exercise to help strengthen her bones and decrease her risk of osteopenia and osteoporosis. Christy [name] agrees to follow up in our office in 3 weeks.   Depression with Emotional Eating Behaviors We discussed behavior modification techniques today to help Christy [name] deal with her emotional eating and depression. She has agreed to take Wellbutrin SR 150 mg qd #30 with no refills. Christy [name] agrees to follow up in our office in 3 weeks.   Obesity Christy [name] is currently in the action stage of change. As such, her goal is to continue with weight loss efforts She has agreed to follow the Category 2 plan Christy [name] has been instructed to work up to a goal of 150 minutes of combined cardio and strengthening exercise per week for weight loss and overall health benefits. We discussed the following Behavioral Modification Strategies today: work on meal planning and easy cooking plans, better snacking choices, planning for success, holiday eating strategies  and emotional eating strategies  Christy [name] has agreed to follow up with our clinic in 3 weeks. She was informed of the importance of frequent follow up visits to maximize her success with intensive lifestyle modifications for her multiple health conditions.   OBESITY BEHAVIORAL INTERVENTION VISIT  Today's visit was # 15   Starting weight: 273 lbs Starting date: 12/21/17 Today's weight : Weight: 256 lb (116.1 kg)  Today's date: 11/09/18 Total lbs lost to date: 20 lbs At least 15 minutes were spent on discussing the following behavioral intervention visit.   ASK: We discussed the diagnosis of obesity with Christy [name] today and T[place]ane agreed to give [name] permission to discuss obesity behavioral modification therapy today.  ASSESS: Myana has the diagnosis of obesity and her BMI  today is @TBMI @ Shaunice is in the action stage of change   ADVISE: Roshanna was educated on the multiple health risks of obesity as well as the benefit of weight loss to improve her health. She was advised of the need for long term treatment and the importance of lifestyle modifications to improve her current health  and to decrease her risk of future health problems.  AGREE: Multiple dietary modification options and treatment options were discussed and  Inda agreed to follow the recommendations documented in the above note.  ARRANGE: Emalina was educated on the importance of frequent visits to treat obesity as outlined per CMS and USPSTF guidelines and agreed to schedule her next follow up appointment today.  I, Ellery Plunk, CMA, am acting as Energy manager for [place], FNP.  I have reviewed the above documentation for accuracy and completeness, and I agree with the above.  -  , FNP-C.

## 2018-11-20 ENCOUNTER — Encounter (INDEPENDENT_AMBULATORY_CARE_PROVIDER_SITE_OTHER): Payer: Self-pay | Admitting: Family Medicine

## 2018-11-30 ENCOUNTER — Ambulatory Visit (INDEPENDENT_AMBULATORY_CARE_PROVIDER_SITE_OTHER): Payer: BC Managed Care – PPO | Admitting: Family Medicine

## 2018-11-30 ENCOUNTER — Encounter (INDEPENDENT_AMBULATORY_CARE_PROVIDER_SITE_OTHER): Payer: Self-pay

## 2018-12-19 ENCOUNTER — Other Ambulatory Visit: Payer: Self-pay | Admitting: Family Medicine

## 2018-12-19 DIAGNOSIS — Z1231 Encounter for screening mammogram for malignant neoplasm of breast: Secondary | ICD-10-CM

## 2018-12-21 ENCOUNTER — Other Ambulatory Visit (INDEPENDENT_AMBULATORY_CARE_PROVIDER_SITE_OTHER): Payer: Self-pay | Admitting: Family Medicine

## 2018-12-21 DIAGNOSIS — F3289 Other specified depressive episodes: Secondary | ICD-10-CM

## 2018-12-26 ENCOUNTER — Encounter (INDEPENDENT_AMBULATORY_CARE_PROVIDER_SITE_OTHER): Payer: Self-pay

## 2018-12-26 ENCOUNTER — Ambulatory Visit (INDEPENDENT_AMBULATORY_CARE_PROVIDER_SITE_OTHER): Payer: BC Managed Care – PPO | Admitting: Family Medicine

## 2019-01-02 ENCOUNTER — Ambulatory Visit (INDEPENDENT_AMBULATORY_CARE_PROVIDER_SITE_OTHER): Payer: BC Managed Care – PPO | Admitting: Family Medicine

## 2019-01-10 ENCOUNTER — Encounter (INDEPENDENT_AMBULATORY_CARE_PROVIDER_SITE_OTHER): Payer: Self-pay

## 2019-01-10 ENCOUNTER — Ambulatory Visit (INDEPENDENT_AMBULATORY_CARE_PROVIDER_SITE_OTHER): Payer: BC Managed Care – PPO | Admitting: Family Medicine

## 2019-01-19 ENCOUNTER — Ambulatory Visit
Admission: RE | Admit: 2019-01-19 | Discharge: 2019-01-19 | Disposition: A | Discharge status: Home / Self Care | Payer: BC Managed Care – PPO | Source: Ambulatory Visit | Attending: Family Medicine | Admitting: Family Medicine

## 2019-01-19 DIAGNOSIS — Z1231 Encounter for screening mammogram for malignant neoplasm of breast: Secondary | ICD-10-CM

## 2019-01-25 ENCOUNTER — Ambulatory Visit (INDEPENDENT_AMBULATORY_CARE_PROVIDER_SITE_OTHER): Payer: BC Managed Care – PPO | Admitting: Family Medicine

## 2019-02-22 ENCOUNTER — Ambulatory Visit (INDEPENDENT_AMBULATORY_CARE_PROVIDER_SITE_OTHER): Payer: BC Managed Care – PPO | Admitting: Family Medicine

## 2019-02-22 VITALS — BP 128/87 | HR 85 | Temp 97.9°F | Ht 69.0 in | Wt 267.0 lb

## 2019-02-22 DIAGNOSIS — Z9189 Other specified personal risk factors, not elsewhere classified: Secondary | ICD-10-CM | POA: Diagnosis not present

## 2019-02-22 DIAGNOSIS — E559 Vitamin D deficiency, unspecified: Secondary | ICD-10-CM

## 2019-02-22 DIAGNOSIS — R7303 Prediabetes: Secondary | ICD-10-CM | POA: Diagnosis not present

## 2019-02-22 DIAGNOSIS — Z6839 Body mass index (BMI) 39.0-39.9, adult: Secondary | ICD-10-CM

## 2019-02-22 DIAGNOSIS — F3289 Other specified depressive episodes: Secondary | ICD-10-CM | POA: Diagnosis not present

## 2019-02-22 MED ORDER — BUPROPION HCL ER (SR) 150 MG PO TB12: 150.0000 mg | ORAL_TABLET | Freq: Every day | ORAL | 0 refills | Status: DC

## 2019-02-22 MED ORDER — VITAMIN D (ERGOCALCIFEROL) 1.25 MG (50000 UNIT) PO CAPS: 50000.0000 [IU] | ORAL_CAPSULE | ORAL | 0 refills | Status: DC

## 2019-02-22 MED ORDER — METFORMIN HCL 500 MG PO TABS: 500.0000 mg | ORAL_TABLET | Freq: Every day | ORAL | 0 refills | Status: DC

## 2019-02-22 NOTE — Progress Notes (Signed)
Office: [phone]  /  Fax: [phone]   HPI:   Chief Complaint: OBESITY Christy [name] is here to discuss her progress with her obesity treatment plan. She is on the Category 2 plan and is following her eating plan approximately 50% of the time. [facility]-3 [facility]. Christy [name]'s last visit was 11/09/2018. She states she has been off of the plan for the last few months and has gained 11 lbs. She is ready to get back on the plan. Her weight is 267 lb (121.1 kg) today and has had a weight gain of 11 lbs since her last visit. She has lost 6 lbs since starting treatment with [name].  Depression with emotional eating behaviors Christy [name] is struggling with emotional eating and using food for comfort to the extent that it is negatively impacting her health. She often snacks when she is not hungry. Christy [name] sometimes feels she is out of control and then feels guilty that she made poor food choices. She has been working on behavior modification techniques to help reduce her emotional eating and has been somewhat successful. She reports feeling that the bupropion helped with her cravings in the afternoon but states she has been off of the bupropion for the last few months since she has not been coming to the clinic.  She shows no sign of suicidal or homicidal ideations.  Depression screen PHQ 2/9 12/21/2017  Decreased Interest 1  Down, Depressed, Hopeless 1  PHQ - 2 Score 2  Altered sleeping 1  Tired, decreased energy 3  Change in appetite 2  Feeling bad or failure about yourself  2  Trouble concentrating 1  Moving slowly or fidgety/restless 1  Suicidal thoughts 0  PHQ-9 Score 12  Difficult doing work/chores Not difficult at all   Vitamin D deficiency Christy [name] has a diagnosis of Vitamin D deficiency and is not at goal. She has been off of Vitamin D for the last few months. She denies nausea, vomiting or muscle weakness.  At risk for osteopenia and osteoporosis Christy [name]  is at higher risk of osteopenia and osteoporosis due to Vitamin D deficiency.   Insulin Resistance Christy [name] has a diagnosis of insulin resistance based on her elevated fasting insulin level >5. She has been in the pre-diabetes range in the past. Although Christy [name]'s blood glucose readings are still under good control, insulin resistance puts her at greater risk of metabolic syndrome and diabetes. She is taking metformin currently and continues to work on diet and exercise to decrease risk of diabetes. She does report polyphagia.  ASSESSMENT AND PLAN:  Vitamin D deficiency - Plan: Vitamin D, Ergocalciferol, (DRISDOL) 1.25 MG (50000 UT) CAPS capsule  Prediabetes - Plan: metFORMIN (GLUCOPHAGE) 500 MG tablet  Other depression - with emotional eating - Plan: buPROPion (WELLBUTRIN SR) 150 MG 12 hr tablet  At risk for osteoporosis  Class 2 severe obesity with serious comorbidity and body mass index (BMI) of 39.0 to 39.9 in adult, unspecified obesity type (HCC)  PLAN:  Depression with Emotional Eating Behaviors We discussed behavior modification techniques today to help Christy [name] deal with her emotional eating and depression. [name] will restart bupropion 150 mg qam #30 with 0 refills. She agrees to follow-up with our clinic in 4 weeks.  Vitamin D Deficiency Christy [name] was informed that low Vitamin D levels contributes to fatigue and are associated with obesity, breast, and colon cancer. She agrees to continue to take prescription Vit D @ 50,000 IU  every week #4 with 0 refills and will follow-up for routine testing of Vitamin D, at least 2-3 times per year. She was informed of the risk of over-replacement of Vitamin D and agrees to not increase her dose unless she discusses this with [name] first. Christy [name] agrees to follow-up with our clinic in 4 weeks.  At risk for osteopenia and osteoporosis Christy [name] was given extended  (15 minutes) osteoporosis prevention counseling today. Christy [name] is at risk for osteopenia and osteoporsis  due to her Vitamin D deficiency. She was encouraged to take her Vitamin D and follow her higher calcium diet and increase strengthening exercise to help strengthen her bones and decrease her risk of osteopenia and osteoporosis.  Insulin Resistance Christy [name] will continue to work on weight loss, exercise, and decreasing simple carbohydrates in her diet to help decrease the risk of diabetes. We dicussed metformin including benefits and risks. She was informed that eating too many simple carbohydrates or too many calories at one sitting increases the likelihood of GI side effects. Christy [name] was given a prescription for a decreased dose of metformin 500 mg daily with lunch #30 with no refills.  Obesity Christy [name] is currently in the action stage of change. As such, her goal is to continue with weight loss efforts. She has agreed to follow the Category 2 plan or journaling 1100-1200 calories + 85 grams of protein daily. Christy [name] has been instructed to continue her exercise regimen as above. We discussed the following Behavioral Modification Strategies today: increasing lean protein intake, decreasing simple carbohydrates, planning for success, and keep a strict food journal.  Christy [name] has agreed to follow-up with our clinic in 4 weeks and will see our Registered Dietitian in 2 weeks. She was informed of the importance of frequent follow up visits to maximize her success with intensive lifestyle modifications for her multiple health conditions.  ALLERGIES: No Known Allergies  MEDICATIONS: Current Outpatient Medications on File Prior to Visit  Medication Sig Dispense Refill  . atorvastatin (LIPITOR) 10 MG tablet Take 1 tablet (10 mg total) by mouth daily. 30 tablet 0  . levothyroxine (SYNTHROID, LEVOTHROID) 150 MCG tablet Take 1 tablet (150 mcg total) by mouth daily before breakfast. 30 tablet 0   No current facility-administered medications on file prior to visit.     PAST MEDICAL HISTORY: Past Medical History:    Diagnosis Date  . Back pain   . Fatigue   . Hypothyroidism   . Infertility, female     PAST SURGICAL HISTORY: No past surgical history on file.  SOCIAL HISTORY: Social History   Tobacco Use  . Smoking status: Never Smoker  . Smokeless tobacco: Never Used  Substance Use Topics  . Alcohol use: No    Frequency: Never  . Drug use: No    FAMILY HISTORY: Family History  Problem Relation Age of Onset  . Diabetes Mother   . Obesity Mother   . Cancer Father   . Breast cancer Maternal Grandmother    ROS: Review of Systems  Constitutional: Negative for weight loss.  Gastrointestinal: Negative for nausea and vomiting.  Musculoskeletal:       Negative for muscle weakness.  Endo/Heme/Allergies:       Positive for polyphagia. Negative for hypoglycemia.  Psychiatric/Behavioral: Positive for depression (emotional eating).   PHYSICAL EXAM: Blood pressure 128/87, pulse 85, temperature 97.9 F (36.6 C), temperature source Oral, height  (1.753 Christy [name]), weight 267 lb (121.1 kg), SpO2 100 %. Body mass index is 39.43 kg/Christy [name]. Physical Exam Vitals  signs reviewed.  Constitutional:      Appearance: Normal appearance. She is obese.  Cardiovascular:     Rate and Rhythm: Normal rate.     Pulses: Normal pulses.  Pulmonary:     Effort: Pulmonary effort is normal.     Breath sounds: Normal breath sounds.  Musculoskeletal: Normal range of motion.  Skin:    General: Skin is warm and dry.  Neurological:     Mental Status: She is alert and oriented to person, place, and time.  Psychiatric:        Behavior: Behavior normal.   RECENT LABS AND TESTS: BMET    Component Value Date/Time   NA 140 09/04/2018 0802   K 4.4 09/04/2018 0802   CL 103 09/04/2018 0802   CO2 22 09/04/2018 0802   GLUCOSE 99 09/04/2018 0802   BUN 18 09/04/2018 0802   CREATININE 0.78 09/04/2018 0802   CALCIUM 9.2 09/04/2018 0802   GFRNONAA 87 09/04/2018 0802   GFRAA 100 09/04/2018 0802   Lab Results  Component  Value Date   HGBA1C 5.5 09/04/2018   HGBA1C 5.8 (H) 04/03/2018   HGBA1C 5.7 (H) 12/21/2017   Lab Results  Component Value Date   INSULIN 5.7 09/04/2018   INSULIN 10.1 04/03/2018   INSULIN 14.4 12/21/2017   CBC    Component Value Date/Time   WBC 6.5 12/21/2017 1057   RBC 4.41 12/21/2017 1057   HGB 12.9 12/21/2017 1057   HCT 39.8 12/21/2017 1057   MCV 90 12/21/2017 1057   MCH 29.3 12/21/2017 1057   MCHC 32.4 12/21/2017 1057   RDW 14.7 12/21/2017 1057   LYMPHSABS 1.9 12/21/2017 1057   EOSABS 0.3 12/21/2017 1057   BASOSABS 0.0 12/21/2017 1057   Iron/TIBC/Ferritin/ %Sat No results found for: IRON, TIBC, FERRITIN, IRONPCTSAT Lipid Panel     Component Value Date/Time   CHOL 241 (H) 09/04/2018 0802   TRIG 124 09/04/2018 0802   HDL 48 09/04/2018 0802   LDLCALC 168 (H) 09/04/2018 0802   Hepatic Function Panel     Component Value Date/Time   PROT 6.6 09/04/2018 0802   ALBUMIN 4.4 09/04/2018 0802   AST 15 09/04/2018 0802   ALT 12 09/04/2018 0802   ALKPHOS 80 09/04/2018 0802   BILITOT 0.5 09/04/2018 0802      Component Value Date/Time   TSH 1.870 09/04/2018 0802   TSH 1.040 04/03/2018 0812   TSH 0.981 12/21/2017 1057    Ref. Range 09/04/2018 08:02  Vitamin D, 25-Hydroxy Latest Ref Range: 30.0 - 100.0 ng/mL 25.9 (L)   OBESITY BEHAVIORAL INTERVENTION VISIT  Today's visit was #16  Starting weight: 273 lbs Starting date: 12/21/2017 Today's weight: 267 lbs  Today's date: 02/22/2019 Total lbs lost to date: 6    02/22/2019  Height 5\' 9"  (1.753 Christy [name])  Weight 267 lb (121.1 kg)  BMI (Calculated) 39.41  BLOOD PRESSURE - SYSTOLIC 128  BLOOD PRESSURE - DIASTOLIC 87   Body Fat % 48 %  Total Body Water (lbs) 90.8 lbs   ASK: We discussed the diagnosis of obesity with Christy [name] today and Christy [name] agreed to give [name] permission to discuss obesity behavioral modification therapy today.  ASSESS: Christy [name] has the diagnosis of obesity and her BMI today is 39.41. Karyle is in the action  stage of change.  ADVISE: Murel was educated on the multiple health risks of obesity as well as the benefit of weight loss to improve her health. She was advised of the need for long term  treatment and the importance of lifestyle modifications to improve her current health and to decrease her risk of future health problems.  AGREE: Multiple dietary modification options and treatment options were discussed and  [place] agreed to follow the recommendations documented in the above note.  ARRANGE: Christy [name] was educated on the importance of frequent visits to treat obesity as outlined per CMS and USPSTF guidelines and agreed to schedule her next follow up appointment today.  IMarianna Payment, am acting as Energy manager for [place], FNP-C.  I have reviewed the above documentation for accuracy and completeness, and I agree with the above.  - Maleak Brazzel, FNP-C.

## 2019-02-26 ENCOUNTER — Encounter (INDEPENDENT_AMBULATORY_CARE_PROVIDER_SITE_OTHER): Payer: Self-pay | Admitting: Family Medicine

## 2019-03-08 ENCOUNTER — Ambulatory Visit (INDEPENDENT_AMBULATORY_CARE_PROVIDER_SITE_OTHER): Payer: BC Managed Care – PPO | Admitting: Dietician

## 2019-03-15 ENCOUNTER — Encounter (INDEPENDENT_AMBULATORY_CARE_PROVIDER_SITE_OTHER): Payer: Self-pay

## 2019-03-15 ENCOUNTER — Ambulatory Visit (INDEPENDENT_AMBULATORY_CARE_PROVIDER_SITE_OTHER): Payer: BC Managed Care – PPO | Admitting: Family Medicine

## 2019-03-18 ENCOUNTER — Other Ambulatory Visit (INDEPENDENT_AMBULATORY_CARE_PROVIDER_SITE_OTHER): Payer: Self-pay | Admitting: Family Medicine

## 2019-03-18 DIAGNOSIS — F3289 Other specified depressive episodes: Secondary | ICD-10-CM

## 2019-03-20 ENCOUNTER — Other Ambulatory Visit (INDEPENDENT_AMBULATORY_CARE_PROVIDER_SITE_OTHER): Payer: Self-pay | Admitting: Family Medicine

## 2019-03-20 DIAGNOSIS — R7303 Prediabetes: Secondary | ICD-10-CM

## 2019-03-20 DIAGNOSIS — E559 Vitamin D deficiency, unspecified: Secondary | ICD-10-CM

## 2019-03-22 ENCOUNTER — Ambulatory Visit (INDEPENDENT_AMBULATORY_CARE_PROVIDER_SITE_OTHER): Payer: BC Managed Care – PPO | Admitting: Family Medicine

## 2019-03-26 ENCOUNTER — Encounter (INDEPENDENT_AMBULATORY_CARE_PROVIDER_SITE_OTHER): Payer: Self-pay | Admitting: Family Medicine

## 2019-03-26 ENCOUNTER — Other Ambulatory Visit: Payer: Self-pay

## 2019-03-26 ENCOUNTER — Ambulatory Visit (INDEPENDENT_AMBULATORY_CARE_PROVIDER_SITE_OTHER): Payer: BC Managed Care – PPO | Admitting: Family Medicine

## 2019-03-26 DIAGNOSIS — E559 Vitamin D deficiency, unspecified: Secondary | ICD-10-CM

## 2019-03-26 DIAGNOSIS — E8881 Metabolic syndrome: Secondary | ICD-10-CM

## 2019-03-26 DIAGNOSIS — Z6839 Body mass index (BMI) 39.0-39.9, adult: Secondary | ICD-10-CM

## 2019-03-26 MED ORDER — METFORMIN HCL 500 MG PO TABS: 500.0000 mg | ORAL_TABLET | Freq: Every day | ORAL | 0 refills | Status: DC

## 2019-03-26 MED ORDER — VITAMIN D (ERGOCALCIFEROL) 1.25 MG (50000 UNIT) PO CAPS: 50000.0000 [IU] | ORAL_CAPSULE | ORAL | 0 refills | Status: DC

## 2019-03-26 NOTE — Progress Notes (Signed)
Office: [phone]  /  Fax: [phone] TeleHealth Visit:  Christy [name] has verbally consented to this TeleHealth visit today. The patient is located at home, the provider is located at the [facility] office. The participants in this visit include the listed provider and patient. The visit was conducted today via FaceTime.  HPI:   Chief Complaint: OBESITY Christy [name] is here to discuss her progress with her obesity treatment plan. She is on the Category 2 plan or journaling 1300 calories + 85 grams of protein daily. She states she is doing HIIT workout 30 minutes 3-4 times per week. 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 She reports weighing 262 lbs yesterday at home. We were unable to weigh the patient today for this TeleHealth visit. She feels as if she has lost 4 lbs since her last visit. She has lost 6 lbs since starting treatment with [name].  Vitamin D deficiency Christy [name] has a diagnosis of Vitamin D deficiency, which is not at goal. Her last Vitamin D level was reported to be 25.9 on 09/04/2018. She states she has been back on prescription Vit D for 1 month and denies nausea, vomiting or muscle weakness.  Insulin Resistance Christy [name] has a diagnosis of insulin resistance based on her elevated fasting insulin level >5. She was previously prediabetic but A1c has reduced.  Although Christy [name]'s blood glucose readings are still under good control, insulin resistance puts her at greater risk of metabolic syndrome and diabetes. She reports polyphagia has improved since being back on bupropion and taking metformin at lunchtime. She continues to work on diet and exercise to decrease risk of diabetes. Lab Results  Component Value Date   HGBA1C 5.5 09/04/2018    ASSESSMENT AND PLAN:  Vitamin D deficiency - Plan: Vitamin D, Ergocalciferol, (DRISDOL) 1.25 MG (50000 UT) CAPS capsule  Insulin resistance - Plan: metFORMIN (GLUCOPHAGE) 500 MG tablet  Class 2 severe obesity with serious  comorbidity and body mass index (BMI) of 39.0 to 39.9 in adult, unspecified obesity type (HCC)  PLAN:  Vitamin D Deficiency Christy [name] was informed that low Vitamin D levels contributes to fatigue and are associated with obesity, breast, and colon cancer. She agrees to continue to take prescription Vit D @ 50,000 IU every week #4 with 0 refills and will follow-up for routine testing of Vitamin D in 2 months. She was informed of the risk of over-replacement of Vitamin D and agrees to not increase her dose unless she discusses this with [name] first. Christy [name] agrees to follow-up with our clinic in 4 weeks.  Insulin Resistance Christy [name] will continue to work on weight loss, exercise, and decreasing simple carbohydrates in her diet to help decrease the risk of diabetes. We dicussed metformin including benefits and risks. She was informed that eating too many simple carbohydrates or too many calories at one sitting increases the likelihood of GI side effects. Christy [name] is currently taking metformin and a refill prescription was written today for 500 mg QD @ lunchtime #30 with 0 refills.Christy [name] agrees to follow-up with our clinic in 4 weeks.  Obesity Christy [name] is currently in the action stage of change. As such, her goal is to continue with weight loss efforts. She has agreed to keep a food journal with 1200-1300 calories and 85 grams of protein daily. Christy [name] has been instructed to continue her current exercise regimen as noted above. We discussed the following Behavioral Modification Strategies today: planning for success and keep a strict food journal.  Christy [name] has agreed to  follow-up with our clinic in 4 weeks. She was informed of the importance of frequent follow-up visits to maximize her success with intensive lifestyle modifications for her multiple health conditions.  ALLERGIES: No Known Allergies  MEDICATIONS: Current Outpatient Medications on File Prior to Visit  Medication Sig Dispense Refill   atorvastatin  (LIPITOR) 10 MG tablet Take 1 tablet (10 mg total) by mouth daily. 30 tablet 0   buPROPion (WELLBUTRIN SR) 150 MG 12 hr tablet TAKE 1 TABLET BY MOUTH EVERY DAY 30 tablet 0   levothyroxine (SYNTHROID, LEVOTHROID) 150 MCG tablet Take 1 tablet (150 mcg total) by mouth daily before breakfast. 30 tablet 0   No current facility-administered medications on file prior to visit.     PAST MEDICAL HISTORY: Past Medical History:  Diagnosis Date   Back pain    Fatigue    Hypothyroidism    Infertility, female     PAST SURGICAL HISTORY: History reviewed. No pertinent surgical history.  SOCIAL HISTORY: Social History   Tobacco Use   Smoking status: Never Smoker   Smokeless tobacco: Never Used  Substance Use Topics   Alcohol use: No    Frequency: Never   Drug use: No    FAMILY HISTORY: Family History  Problem Relation Age of Onset   Diabetes Mother    Obesity Mother    Cancer Father    Breast cancer Maternal Grandmother    ROS: Review of Systems  Gastrointestinal: Negative for nausea and vomiting.  Musculoskeletal:       Negative for muscle weakness.  Endo/Heme/Allergies:       Positive for improved polyphagia.   PHYSICAL EXAM: Pt in no acute distress  RECENT LABS AND TESTS: BMET    Component Value Date/Time   NA 140 09/04/2018 0802   K 4.4 09/04/2018 0802   CL 103 09/04/2018 0802   CO2 22 09/04/2018 0802   GLUCOSE 99 09/04/2018 0802   BUN 18 09/04/2018 0802   CREATININE 0.78 09/04/2018 0802   CALCIUM 9.2 09/04/2018 0802   GFRNONAA 87 09/04/2018 0802   GFRAA 100 09/04/2018 0802   Lab Results  Component Value Date   HGBA1C 5.5 09/04/2018   HGBA1C 5.8 (H) 04/03/2018   HGBA1C 5.7 (H) 12/21/2017   Lab Results  Component Value Date   INSULIN 5.7 09/04/2018   INSULIN 10.1 04/03/2018   INSULIN 14.4 12/21/2017   CBC    Component Value Date/Time   WBC 6.5 12/21/2017 1057   RBC 4.41 12/21/2017 1057   HGB 12.9 12/21/2017 1057   HCT 39.8 12/21/2017  1057   MCV 90 12/21/2017 1057   MCH 29.3 12/21/2017 1057   MCHC 32.4 12/21/2017 1057   RDW 14.7 12/21/2017 1057   LYMPHSABS 1.9 12/21/2017 1057   EOSABS 0.3 12/21/2017 1057   BASOSABS 0.0 12/21/2017 1057   Iron/TIBC/Ferritin/ %Sat No results found for: IRON, TIBC, FERRITIN, IRONPCTSAT Lipid Panel     Component Value Date/Time   CHOL 241 (H) 09/04/2018 0802   TRIG 124 09/04/2018 0802   HDL 48 09/04/2018 0802   LDLCALC 168 (H) 09/04/2018 0802   Hepatic Function Panel     Component Value Date/Time   PROT 6.6 09/04/2018 0802   ALBUMIN 4.4 09/04/2018 0802   AST 15 09/04/2018 0802   ALT 12 09/04/2018 0802   ALKPHOS 80 09/04/2018 0802   BILITOT 0.5 09/04/2018 0802      Component Value Date/Time   TSH 1.870 09/04/2018 0802   TSH 1.040 04/03/2018 [id number]  TSH 0.981 12/21/2017 1057   Results for AKYAH, LAGRANGE (MRN [id number]) as of 03/26/2019 16:52  Ref. Range 09/04/2018 08:02  Vitamin D, 25-Hydroxy Latest Ref Range: 30.0 - 100.0 ng/mL 25.9 (L)   I, Marianna Payment, am acting as Energy manager for [place], FNP-C.  I have reviewed the above documentation for accuracy and completeness, and I agree with the above.  - Dawn Whitmire, FNP-C.

## 2019-03-27 ENCOUNTER — Encounter (INDEPENDENT_AMBULATORY_CARE_PROVIDER_SITE_OTHER): Payer: Self-pay | Admitting: Family Medicine

## 2019-03-27 DIAGNOSIS — E88819 Insulin resistance, unspecified: Secondary | ICD-10-CM | POA: Insufficient documentation

## 2019-03-27 DIAGNOSIS — E8881 Metabolic syndrome: Secondary | ICD-10-CM | POA: Insufficient documentation

## 2019-04-17 ENCOUNTER — Other Ambulatory Visit (INDEPENDENT_AMBULATORY_CARE_PROVIDER_SITE_OTHER): Payer: Self-pay | Admitting: Family Medicine

## 2019-04-17 DIAGNOSIS — E559 Vitamin D deficiency, unspecified: Secondary | ICD-10-CM

## 2019-04-20 ENCOUNTER — Other Ambulatory Visit (INDEPENDENT_AMBULATORY_CARE_PROVIDER_SITE_OTHER): Payer: Self-pay | Admitting: Family Medicine

## 2019-04-20 DIAGNOSIS — E8881 Metabolic syndrome: Secondary | ICD-10-CM

## 2019-04-23 ENCOUNTER — Encounter (INDEPENDENT_AMBULATORY_CARE_PROVIDER_SITE_OTHER): Payer: Self-pay

## 2019-04-23 ENCOUNTER — Ambulatory Visit (INDEPENDENT_AMBULATORY_CARE_PROVIDER_SITE_OTHER): Payer: Self-pay | Admitting: Family Medicine

## 2019-06-05 ENCOUNTER — Other Ambulatory Visit (INDEPENDENT_AMBULATORY_CARE_PROVIDER_SITE_OTHER): Payer: Self-pay | Admitting: Family Medicine

## 2019-06-05 DIAGNOSIS — E038 Other specified hypothyroidism: Secondary | ICD-10-CM

## 2019-06-05 MED ORDER — LEVOTHYROXINE SODIUM 150 MCG PO TABS: 150.0000 ug | ORAL_TABLET | Freq: Every day | ORAL | 0 refills | Status: DC

## 2019-06-12 ENCOUNTER — Encounter (INDEPENDENT_AMBULATORY_CARE_PROVIDER_SITE_OTHER): Payer: Self-pay | Admitting: Family Medicine

## 2019-06-12 ENCOUNTER — Other Ambulatory Visit: Payer: Self-pay

## 2019-06-12 ENCOUNTER — Telehealth (INDEPENDENT_AMBULATORY_CARE_PROVIDER_SITE_OTHER): Payer: BC Managed Care – PPO | Admitting: Family Medicine

## 2019-06-12 DIAGNOSIS — E8881 Metabolic syndrome: Secondary | ICD-10-CM

## 2019-06-12 DIAGNOSIS — F3289 Other specified depressive episodes: Secondary | ICD-10-CM | POA: Diagnosis not present

## 2019-06-12 DIAGNOSIS — E7849 Other hyperlipidemia: Secondary | ICD-10-CM | POA: Diagnosis not present

## 2019-06-12 DIAGNOSIS — E88819 Insulin resistance, unspecified: Secondary | ICD-10-CM

## 2019-06-12 DIAGNOSIS — Z6839 Body mass index (BMI) 39.0-39.9, adult: Secondary | ICD-10-CM

## 2019-06-12 DIAGNOSIS — E559 Vitamin D deficiency, unspecified: Secondary | ICD-10-CM

## 2019-06-12 MED ORDER — METFORMIN HCL 500 MG PO TABS: 500.0000 mg | ORAL_TABLET | Freq: Two times a day (BID) | ORAL | 0 refills | Status: DC

## 2019-06-12 MED ORDER — ATORVASTATIN CALCIUM 10 MG PO TABS: 10.0000 mg | ORAL_TABLET | Freq: Every day | ORAL | 0 refills | Status: DC

## 2019-06-12 MED ORDER — BUPROPION HCL ER (SR) 150 MG PO TB12: 150.0000 mg | ORAL_TABLET | Freq: Every day | ORAL | 0 refills | Status: DC

## 2019-06-12 MED ORDER — VITAMIN D (ERGOCALCIFEROL) 1.25 MG (50000 UNIT) PO CAPS: 50000.0000 [IU] | ORAL_CAPSULE | ORAL | 0 refills | Status: DC

## 2019-06-12 NOTE — Progress Notes (Addendum)
Office: [phone]  /  Fax: [phone] TeleHealth Visit:  Myrene Bougher has verbally consented to this TeleHealth visit today. The patient is located at home, the provider is located at the [facility] office. The participants in this visit include the listed provider and patient. The visit was conducted today via face time.  HPI:   Chief Complaint: OBESITY Admire is here to discuss her progress with her obesity treatment plan. She is on the keep a food journal with 1200-1300 calories and 85 grams of protein daily and is following her eating plan approximately 25 % of the time. She states she is exercising 0 minutes 0 times per week. Jalena's last visit was 03/26/2019 and reported weight was 262 lbs. Her reporting weight today is 274 lbs. She tends to struggle at dinner and after dinner.  We were unable to weigh the patient today for this TeleHealth visit. She feels as if she has gained 12 lbs since her last visit. She has lost 6 lbs since starting treatment with [name].  Insulin Resistance Azia has a diagnosis of insulin resistance based on her elevated fasting insulin level >5. Although Samanthajo's blood glucose readings are still under good control, insulin resistance puts her at greater risk of metabolic syndrome and diabetes. She notes polyphagia and has been off metformin because she ran out of refills. She continues to work on diet and exercise to decrease risk of diabetes. Lab Results  Component Value Date   HGBA1C 5.5 09/04/2018    Vitamin D Deficiency Itzayana has a diagnosis of vitamin D deficiency. She is currently taking prescription Vit D, but level is not at goal. Last Vit D level was 25.9 on 09/04/18. She denies nausea, vomiting or muscle weakness.  Hyperlipidemia Akeia has hyperlipidemia and has been trying to improve her cholesterol levels with intensive lifestyle modification including a low saturated fat diet, exercise and weight loss. Last LDL was not at goal at 168  on 09/04/18. Her triglycerides was within normal limits and HDL was a bit low at 48. She denies any chest pain or shortness of breath. The 10-year ASCVD risk score Mikey Bussing DC Brooke Bonito., et al., 2013) is: 2.5%   Values used to calculate the score:     Age: 31 years     Sex: Female     Is Non-Hispanic African American: No     Diabetic: No     Tobacco smoker: No     Systolic Blood Pressure: 631 mmHg     Is BP treated: No     HDL Cholesterol: 48 mg/dL     Total Cholesterol: 241 mg/dL Lab Results  Component Value Date   CHOL 241 (H) 09/04/2018   HDL 48 09/04/2018   LDLCALC 168 (H) 09/04/2018   TRIG 124 09/04/2018   Depression with Emotional Eating Behaviors Chianne has been struggling with cravings after dinner. She has been off bupropion because she ran out. Camren struggles with emotional eating and using food for comfort to the extent that it is negatively impacting her health. She often snacks when she is not hungry. [place] sometimes feels she is out of control and then feels guilty that she made poor food choices. She has been working on behavior modification techniques to help reduce her emotional eating and has been somewhat successful. She shows no sign of suicidal or homicidal ideations.  Depression screen PHQ 2/9 12/21/2017  Decreased Interest 1  Down, Depressed, Hopeless 1  PHQ - 2 Score 2  Altered sleeping  1  Tired, decreased energy 3  Change in appetite 2  Feeling bad or failure about yourself  2  Trouble concentrating 1  Moving slowly or fidgety/restless 1  Suicidal thoughts 0  PHQ-9 Score 12  Difficult doing work/chores Not difficult at all    ASSESSMENT AND PLAN:  Insulin resistance - Plan: metFORMIN (GLUCOPHAGE) 500 MG tablet  Other depression - with emotional eating - Plan: buPROPion (WELLBUTRIN SR) 150 MG 12 hr tablet  Vitamin D deficiency - Plan: Vitamin D, Ergocalciferol, (DRISDOL) 1.25 MG (50000 UT) CAPS capsule  Other hyperlipidemia - Plan: atorvastatin (LIPITOR) 10 MG  tablet  PLAN:  Insulin Resistance [name] will continue to work on weight loss, exercise, and decreasing simple carbohydrates in her diet to help decrease the risk of diabetes. We dicussed metformin including benefits and risks. She was informed that eating too many simple carbohydrates or too many calories at one sitting increases the likelihood of GI side effects. [name] agrees to continue taking metformin 500 mg q AM #30 and we will refill for 1 month. We will recheck fasting insulin and A1c at next office visit. [name] agrees to follow up with our clinic in 2 weeks as directed to monitor her progress.  Vitamin D Deficiency [name] was informed that low vitamin D levels contributes to fatigue and are associated with obesity, breast, and colon cancer. [name] agrees to continue taking prescription Vit D 50,000 IU every week #4 and we will refill for 1 month. She will follow up for routine testing of vitamin D, at least 2-3 times per year. She was informed of the risk of over-replacement of vitamin D and agrees to not increase her dose unless she discusses this with us first. We will recheck Vit D level at next office visit. [name] agrees to follow up with our clinic in 2 weeks.  Hyperlipidemia [name] was informed of the American Heart Association Guidelines emphasizing intensive lifestyle modifications as the first line treatment for hyperlipidemia. We discussed many lifestyle modifications today in depth, and [name] will continue to work on decreasing saturated fats such as fatty red meat, butter and many fried foods. She will also increase vegetables and lean protein in her diet and continue to work on exercise and weight loss efforts. [name] agrees to continue taking atorvastatin 10 mg qd #30 and we will refill for 1 month. [name] agrees to follow up with our clinic in 2 weeks.  Depression with Emotional Eating Behaviors We discussed behavior modification techniques today to help [name] deal with her emotional  eating and depression. [name] agrees to continue taking bupropion 150 mg q AM #30 and we will refill for 1 month. [name] agrees to follow up with our clinic in 2 weeks.  Obesity [name] is currently in the action stage of change. As such, her goal is to continue with weight loss efforts She has agreed to keep a food journal with 1200-1300 calories and 85 grams of protein daily [name] has been instructed to work up to a goal of 150 minutes of combined cardio and strengthening exercise per week or start walking for 30 minutes 3 times per week for weight loss and overall health benefits. We discussed the following Behavioral Modification Strategies today: increasing lean protein intake, decreasing simple carbohydrates, planning for success, and keep a strict food journal  MyChart message sent with journaling information.  [name] has agreed to follow up with our clinic in 2 weeks. She was informed of the importance of frequent follow up visits to  maximize her success with intensive lifestyle modifications for her multiple health conditions.  ALLERGIES: No Known Allergies  MEDICATIONS: Current Outpatient Medications on File Prior to Visit  Medication Sig Dispense Refill  . levothyroxine (SYNTHROID) 150 MCG tablet Take 1 tablet (150 mcg total) by mouth daily before breakfast. 30 tablet 0   No current facility-administered medications on file prior to visit.     PAST MEDICAL HISTORY: Past Medical History:  Diagnosis Date  . Back pain   . Fatigue   . Hypothyroidism   . Infertility, female     PAST SURGICAL HISTORY: History reviewed. No pertinent surgical history.  SOCIAL HISTORY: Social History   Tobacco Use  . Smoking status: Never Smoker  . Smokeless tobacco: Never Used  Substance Use Topics  . Alcohol use: No    Frequency: Never  . Drug use: No    FAMILY HISTORY: Family History  Problem Relation Age of Onset  . Diabetes Mother   . Obesity Mother   . Cancer Father   . Breast  cancer Maternal Grandmother     ROS: Review of Systems  Constitutional: Negative for weight loss.  Respiratory: Negative for shortness of breath.   Cardiovascular: Negative for chest pain.  Gastrointestinal: Negative for nausea and vomiting.  Musculoskeletal:       Negative muscle weakness  Endo/Heme/Allergies:       Positive polyphagia  Psychiatric/Behavioral: Positive for depression. Negative for suicidal ideas.    PHYSICAL EXAM: Pt in no acute distress  RECENT LABS AND TESTS: BMET    Component Value Date/Time   NA 140 09/04/2018 0802   K 4.4 09/04/2018 0802   CL 103 09/04/2018 0802   CO2 22 09/04/2018 0802   GLUCOSE 99 09/04/2018 0802   BUN 18 09/04/2018 0802   CREATININE 0.78 09/04/2018 0802   CALCIUM 9.2 09/04/2018 0802   GFRNONAA 87 09/04/2018 0802   GFRAA 100 09/04/2018 0802   Lab Results  Component Value Date   HGBA1C 5.5 09/04/2018   HGBA1C 5.8 (H) 04/03/2018   HGBA1C 5.7 (H) 12/21/2017   Lab Results  Component Value Date   INSULIN 5.7 09/04/2018   INSULIN 10.1 04/03/2018   INSULIN 14.4 12/21/2017   CBC    Component Value Date/Time   WBC 6.5 12/21/2017 1057   RBC 4.41 12/21/2017 1057   HGB 12.9 12/21/2017 1057   HCT 39.8 12/21/2017 1057   MCV 90 12/21/2017 1057   MCH 29.3 12/21/2017 1057   MCHC 32.4 12/21/2017 1057   RDW 14.7 12/21/2017 1057   LYMPHSABS 1.9 12/21/2017 1057   EOSABS 0.3 12/21/2017 1057   BASOSABS 0.0 12/21/2017 1057   Iron/TIBC/Ferritin/ %Sat No results found for: IRON, TIBC, FERRITIN, IRONPCTSAT Lipid Panel     Component Value Date/Time   CHOL 241 (H) 09/04/2018 0802   TRIG 124 09/04/2018 0802   HDL 48 09/04/2018 0802   LDLCALC 168 (H) 09/04/2018 0802   Hepatic Function Panel     Component Value Date/Time   PROT 6.6 09/04/2018 0802   ALBUMIN 4.4 09/04/2018 0802   AST 15 09/04/2018 0802   ALT 12 09/04/2018 0802   ALKPHOS 80 09/04/2018 0802   BILITOT 0.5 09/04/2018 0802      Component Value Date/Time   TSH  1.870 09/04/2018 0802   TSH 1.040 04/03/2018 0812   TSH 0.981 12/21/2017 1057      I, Burt KnackSharon Leotha Voeltz, am acting as Energy managertranscriptionist for AshlandDawn Whitmire, FNP-C  I have reviewed the above documentation for accuracy and  completeness, and I agree with the above.  - Dawn Whitmire, FNP-C.

## 2019-06-13 MED ORDER — METFORMIN HCL 500 MG PO TABS: 500.0000 mg | ORAL_TABLET | Freq: Every day | ORAL | 0 refills | Status: AC

## 2019-06-14 DIAGNOSIS — F32A Depression, unspecified: Secondary | ICD-10-CM | POA: Insufficient documentation

## 2019-06-14 DIAGNOSIS — F329 Major depressive disorder, single episode, unspecified: Secondary | ICD-10-CM | POA: Insufficient documentation

## 2019-06-28 ENCOUNTER — Other Ambulatory Visit: Payer: Self-pay

## 2019-06-28 ENCOUNTER — Ambulatory Visit (INDEPENDENT_AMBULATORY_CARE_PROVIDER_SITE_OTHER): Payer: BC Managed Care – PPO | Admitting: Bariatrics

## 2019-06-28 ENCOUNTER — Encounter (INDEPENDENT_AMBULATORY_CARE_PROVIDER_SITE_OTHER): Payer: Self-pay | Admitting: Bariatrics

## 2019-06-28 VITALS — BP 112/82 | HR 79 | Temp 97.9°F | Ht 69.0 in | Wt 267.0 lb

## 2019-06-28 DIAGNOSIS — E8881 Metabolic syndrome: Secondary | ICD-10-CM

## 2019-06-28 DIAGNOSIS — E559 Vitamin D deficiency, unspecified: Secondary | ICD-10-CM | POA: Diagnosis not present

## 2019-06-28 DIAGNOSIS — Z6839 Body mass index (BMI) 39.0-39.9, adult: Secondary | ICD-10-CM

## 2019-06-28 DIAGNOSIS — E038 Other specified hypothyroidism: Secondary | ICD-10-CM | POA: Diagnosis not present

## 2019-06-28 DIAGNOSIS — Z9189 Other specified personal risk factors, not elsewhere classified: Secondary | ICD-10-CM

## 2019-06-28 MED ORDER — LEVOTHYROXINE SODIUM 150 MCG PO TABS: 150.0000 ug | ORAL_TABLET | Freq: Every day | ORAL | 0 refills | Status: DC

## 2019-06-28 NOTE — Progress Notes (Signed)
.  ins

## 2019-06-28 NOTE — Progress Notes (Signed)
Office: [phone]  /  Fax: [phone]   HPI:   Chief Complaint: OBESITY Christy [name] is here to discuss her progress with her obesity treatment plan. She is keeping a food journal with 1200-1300 calories and 85 grams of protein and is following her eating plan approximately 75% of the time. She states she is exercising 0 minutes 0 times per week. Christy [name] is feeling more back on track. She states she is missing the gym at this time.  Her weight is 267 lb (121.1 kg) today and has not lost weight since her last in office visit 4 months ago. She has lost 6 lbs since starting treatment with us.  Insulin Resistance Christy [name] has a diagnosis of insulin resistance based on her elevated fasting insulin level >5. Although Aquita's blood glucose readings are still under good control, insulin resistance puts her at greater risk of metabolic syndrome and diabetes. She is taking metformin currently and continues to work on diet and exercise to decrease risk of diabetes.  At risk for diabetes Christy [name] is at higher than averagerisk for developing diabetes due to her obesity. She currently denies polyuria or polydipsia.  Vitamin D deficiency Christy [name] has a diagnosis of Vitamin D deficiency. She is currently taking Vit D and denies nausea, vomiting or muscle weakness.  Hypothyroidism Christy [name] has a diagnosis of hypothyroidism. She is not on levothyroxine. She denies temperature sensitivity or fatigue.  ASSESSMENT AND PLAN:  Insulin resistance - Plan: Comprehensive metabolic panel, Hemoglobin A1c, Insulin, random, Lipid Panel With LDL/HDL Ratio  Vitamin D deficiency - Plan: VITAMIN D 25 Hydroxy (Vit-D Deficiency, Fractures)  Other specified hypothyroidism - Plan: levothyroxine (SYNTHROID) 150 MCG tablet  At risk for diabetes mellitus  Class 2 severe obesity with serious comorbidity and body mass index (BMI) of 39.0 to 39.9 in adult, unspecified obesity type (HCC)  PLAN:  Insulin Resistance Christy [name] will continue to  work on weight loss, exercise, and decreasing simple carbohydrates in her diet to help decrease the risk of diabetes. We dicussed metformin including benefits and risks. She was informed that eating too many simple carbohydrates or too many calories at one sitting increases the likelihood of GI side effects. Christy [name] will continue metformin and will follow-up with us as directed to monitor her progress.  Diabetes risk counseling Christy [name] was given extended (15 minutes) diabetes prevention counseling today. She is 53 y.o. female and has risk factors for diabetes including obesity. We discussed intensive lifestyle modifications today with an emphasis on weight loss as well as increasing exercise and decreasing simple carbohydrates in her diet.  Vitamin D Deficiency Christy [name] was informed that low Vitamin D levels contributes to fatigue and are associated with obesity, breast, and colon cancer. She agrees to continue taking Vit D and will follow-up for routine testing of Vitamin D, at least 2-3 times per year. She was informed of the risk of over-replacement of Vitamin D and agrees to not increase her dose unless she discusses this with us first. Christy [name] agrees to follow-up with our clinic in 2 weeks.  Hypothyroidism Christy [name] was informed of the importance of good thyroid control to help with weight loss efforts. She was also informed that supertheraputic thyroid levels are dangerous and will not improve weight loss results. Christy [name] was given a prescription for levothyroxine 150 mcg 1 QD before breakfast #90 with 0 refills. She agrees to follow-up with our clinic in 2 weeks.  Obesity Christy [name] is currently in the action stage of change. As such, her goal is to continue  with weight loss efforts. She has agreed to keep a food journal with 1200-1300 calories and 85 grams of protein. Christy [name] will work on meal planning, intentional eating, and having protein at each meal. Christy [name] is wearing a Education officer, museumit Bit and she will start walking and  exercising via You Tube videos for weight loss and overall health benefits. We discussed the following Behavioral Modification Strategies today: increasing lean protein intake, decreasing simple carbohydrates, increasing vegetables, increase H20 intake, decrease eating out, no skipping meals, work on meal planning, easy cooking plans, and keeping healthy foods in the home.  Christy [name] has agreed to follow-up with our clinic in 2 weeks. She was informed of the importance of frequent follow-up visits to maximize her success with intensive lifestyle modifications for her multiple health conditions.  ALLERGIES: No Known Allergies  MEDICATIONS: Current Outpatient Medications on File Prior to Visit  Medication Sig Dispense Refill  . atorvastatin (LIPITOR) 10 MG tablet Take 1 tablet (10 mg total) by mouth daily. 30 tablet 0  . buPROPion (WELLBUTRIN SR) 150 MG 12 hr tablet Take 1 tablet (150 mg total) by mouth daily. 30 tablet 0  . metFORMIN (GLUCOPHAGE) 500 MG tablet Take 1 tablet (500 mg total) by mouth daily with breakfast. 30 tablet 0  . Vitamin D, Ergocalciferol, (DRISDOL) 1.25 MG (50000 UT) CAPS capsule Take 1 capsule (50,000 Units total) by mouth every 7 (seven) days. 4 capsule 0   No current facility-administered medications on file prior to visit.     PAST MEDICAL HISTORY: Past Medical History:  Diagnosis Date  . Back pain   . Fatigue   . Hypothyroidism   . Infertility, female     PAST SURGICAL HISTORY: History reviewed. No pertinent surgical history.  SOCIAL HISTORY: Social History   Tobacco Use  . Smoking status: Never Smoker  . Smokeless tobacco: Never Used  Substance Use Topics  . Alcohol use: No    Frequency: Never  . Drug use: No    FAMILY HISTORY: Family History  Problem Relation Age of Onset  . Diabetes Mother   . Obesity Mother   . Cancer Father   . Breast cancer Maternal Grandmother    ROS: Review of Systems  Constitutional: Negative for malaise/fatigue.   Gastrointestinal: Negative for nausea and vomiting.  Musculoskeletal:       Negative for muscle weakness.  Endo/Heme/Allergies:       Negative for temperature sensitivity.   PHYSICAL EXAM: Blood pressure 112/82, pulse 79, temperature 97.9 F (36.6 C), temperature source Oral, height 5\' 9"  (1.753 m), weight 267 lb (121.1 kg), SpO2 98 %. Body mass index is 39.43 kg/m. Physical Exam Vitals signs reviewed.  Constitutional:      Appearance: Normal appearance. She is obese.  Cardiovascular:     Rate and Rhythm: Normal rate.     Pulses: Normal pulses.  Pulmonary:     Effort: Pulmonary effort is normal.     Breath sounds: Normal breath sounds.  Musculoskeletal: Normal range of motion.  Skin:    General: Skin is warm and dry.  Neurological:     Mental Status: She is alert and oriented to person, place, and time.  Psychiatric:        Behavior: Behavior normal.   RECENT LABS AND TESTS: BMET    Component Value Date/Time   NA 140 09/04/2018 0802   K 4.4 09/04/2018 0802   CL 103 09/04/2018 0802   CO2 22 09/04/2018 0802   GLUCOSE 99 09/04/2018 0802   BUN  18 09/04/2018 0802   CREATININE 0.78 09/04/2018 0802   CALCIUM 9.2 09/04/2018 0802   GFRNONAA 87 09/04/2018 0802   GFRAA 100 09/04/2018 0802   Lab Results  Component Value Date   HGBA1C 5.5 09/04/2018   HGBA1C 5.8 (H) 04/03/2018   HGBA1C 5.7 (H) 12/21/2017   Lab Results  Component Value Date   INSULIN 5.7 09/04/2018   INSULIN 10.1 04/03/2018   INSULIN 14.4 12/21/2017   CBC    Component Value Date/Time   WBC 6.5 12/21/2017 1057   RBC 4.41 12/21/2017 1057   HGB 12.9 12/21/2017 1057   HCT 39.8 12/21/2017 1057   MCV 90 12/21/2017 1057   MCH 29.3 12/21/2017 1057   MCHC 32.4 12/21/2017 1057   RDW 14.7 12/21/2017 1057   LYMPHSABS 1.9 12/21/2017 1057   EOSABS 0.3 12/21/2017 1057   BASOSABS 0.0 12/21/2017 1057   Iron/TIBC/Ferritin/ %Sat No results found for: IRON, TIBC, FERRITIN, IRONPCTSAT Lipid Panel      Component Value Date/Time   CHOL 241 (H) 09/04/2018 0802   TRIG 124 09/04/2018 0802   HDL 48 09/04/2018 0802   LDLCALC 168 (H) 09/04/2018 0802   Hepatic Function Panel     Component Value Date/Time   PROT 6.6 09/04/2018 0802   ALBUMIN 4.4 09/04/2018 0802   AST 15 09/04/2018 0802   ALT 12 09/04/2018 0802   ALKPHOS 80 09/04/2018 0802   BILITOT 0.5 09/04/2018 0802      Component Value Date/Time   TSH 1.870 09/04/2018 0802   TSH 1.040 04/03/2018 0812   TSH 0.981 12/21/2017 1057   Results for KARALYNE, NUSSER (MRN [id number]) as of 06/28/2019 11:57  Ref. Range 09/04/2018 08:02  Vitamin D, 25-Hydroxy Latest Ref Range: 30.0 - 100.0 ng/mL 25.9 (L)   OBESITY BEHAVIORAL INTERVENTION VISIT  Today's visit was #19   Starting weight: 273 lbs Starting date: 12/21/2017 Today's weight: 267 lbs  Today's date: 06/28/2019 Total lbs lost to date: 6   06/28/2019  Height 5\' 9"  (1.753 m)  Weight 267 lb (121.1 kg)  BMI (Calculated) 39.41  BLOOD PRESSURE - SYSTOLIC 892  BLOOD PRESSURE - DIASTOLIC 82   Body Fat % 11.9 %  Total Body Water (lbs) 89.2 lbs   ASK: We discussed the diagnosis of obesity with Kathlen Mody today and Ronya agreed to give [name] permission to discuss obesity behavioral modification therapy today.  ASSESS: Baby has the diagnosis of obesity and her BMI today is 39.5. Deseree is in the action stage of change.   ADVISE: Murdis was educated on the multiple health risks of obesity as well as the benefit of weight loss to improve her health. She was advised of the need for long term treatment and the importance of lifestyle modifications to improve her current health and to decrease her risk of future health problems.  AGREE: Multiple dietary modification options and treatment options were discussed and  Zenna agreed to follow the recommendations documented in the above note.  ARRANGE: Anzley was educated on the importance of frequent visits to treat obesity as outlined per CMS and  USPSTF guidelines and agreed to schedule her next follow up appointment today.  Migdalia Dk, am acting as Location manager for [employer], DO  I have reviewed the above documentation for accuracy and completeness, and I agree with the above. -Jearld Lesch, DO

## 2019-06-29 LAB — COMPREHENSIVE METABOLIC PANEL
ALT: 14 IU/L (ref 0–32)
AST: 19 IU/L (ref 0–40)
Albumin/Globulin Ratio: 2.4 — ABNORMAL HIGH (ref 1.2–2.2)
Albumin: 4.7 g/dL (ref 3.8–4.9)
Alkaline Phosphatase: 79 IU/L (ref 39–117)
BUN/Creatinine Ratio: 15 (ref 9–23)
BUN: 14 mg/dL (ref 6–24)
Bilirubin Total: 0.5 mg/dL (ref 0.0–1.2)
CO2: 21 mmol/L (ref 20–29)
Calcium: 9.3 mg/dL (ref 8.7–10.2)
Chloride: 105 mmol/L (ref 96–106)
Creatinine, Ser: 0.93 mg/dL (ref 0.57–1.00)
GFR calc Af Amer: 81 mL/min/{1.73_m2} (ref >59)
GFR calc non Af Amer: 70 mL/min/{1.73_m2} (ref >59)
Globulin, Total: 2 g/dL (ref 1.5–4.5)
Glucose: 99 mg/dL (ref 65–99)
Potassium: 4.5 mmol/L (ref 3.5–5.2)
Sodium: 141 mmol/L (ref 134–144)
Total Protein: 6.7 g/dL (ref 6.0–8.5)

## 2019-06-29 LAB — HEMOGLOBIN A1C
Est. average glucose Bld gHb Est-mCnc: 111 mg/dL
Hgb A1c MFr Bld: 5.5 % (ref 4.8–5.6)

## 2019-06-29 LAB — LIPID PANEL WITH LDL/HDL RATIO
Cholesterol, Total: 183 mg/dL (ref 100–199)
HDL: 43 mg/dL (ref >39)
LDL Calculated: 114 mg/dL — ABNORMAL HIGH (ref 0–99)
LDl/HDL Ratio: 2.7 ratio (ref 0.0–3.2)
Triglycerides: 130 mg/dL (ref 0–149)
VLDL Cholesterol Cal: 26 mg/dL (ref 5–40)

## 2019-06-29 LAB — VITAMIN D 25 HYDROXY (VIT D DEFICIENCY, FRACTURES): Vit D, 25-Hydroxy: 27.3 ng/mL — ABNORMAL LOW (ref 30.0–100.0)

## 2019-06-29 LAB — INSULIN, RANDOM: INSULIN: 9.3 u[IU]/mL (ref 2.6–24.9)

## 2019-07-19 ENCOUNTER — Other Ambulatory Visit (INDEPENDENT_AMBULATORY_CARE_PROVIDER_SITE_OTHER): Payer: Self-pay | Admitting: Family Medicine

## 2019-07-19 DIAGNOSIS — F3289 Other specified depressive episodes: Secondary | ICD-10-CM

## 2019-07-22 ENCOUNTER — Other Ambulatory Visit (INDEPENDENT_AMBULATORY_CARE_PROVIDER_SITE_OTHER): Payer: Self-pay | Admitting: Bariatrics

## 2019-07-22 DIAGNOSIS — E038 Other specified hypothyroidism: Secondary | ICD-10-CM

## 2019-07-23 ENCOUNTER — Other Ambulatory Visit (INDEPENDENT_AMBULATORY_CARE_PROVIDER_SITE_OTHER): Payer: Self-pay | Admitting: Bariatrics

## 2019-07-23 DIAGNOSIS — F3289 Other specified depressive episodes: Secondary | ICD-10-CM

## 2019-07-23 MED ORDER — BUPROPION HCL ER (SR) 150 MG PO TB12: 150.0000 mg | ORAL_TABLET | Freq: Every day | ORAL | 0 refills | Status: DC

## 2019-07-26 ENCOUNTER — Ambulatory Visit (INDEPENDENT_AMBULATORY_CARE_PROVIDER_SITE_OTHER): Payer: BC Managed Care – PPO | Admitting: Bariatrics

## 2019-08-01 ENCOUNTER — Ambulatory Visit (INDEPENDENT_AMBULATORY_CARE_PROVIDER_SITE_OTHER): Payer: BC Managed Care – PPO | Admitting: Bariatrics

## 2019-08-20 ENCOUNTER — Encounter (INDEPENDENT_AMBULATORY_CARE_PROVIDER_SITE_OTHER): Payer: Self-pay | Admitting: Bariatrics

## 2019-08-22 ENCOUNTER — Ambulatory Visit (INDEPENDENT_AMBULATORY_CARE_PROVIDER_SITE_OTHER): Payer: BC Managed Care – PPO | Admitting: Bariatrics

## 2019-09-05 ENCOUNTER — Ambulatory Visit (INDEPENDENT_AMBULATORY_CARE_PROVIDER_SITE_OTHER): Payer: BC Managed Care – PPO | Admitting: Family Medicine

## 2019-09-06 ENCOUNTER — Other Ambulatory Visit (INDEPENDENT_AMBULATORY_CARE_PROVIDER_SITE_OTHER): Payer: Self-pay | Admitting: Bariatrics

## 2019-09-06 DIAGNOSIS — E038 Other specified hypothyroidism: Secondary | ICD-10-CM

## 2019-09-06 MED ORDER — LEVOTHYROXINE SODIUM 150 MCG PO TABS: 150.0000 ug | ORAL_TABLET | Freq: Every day | ORAL | 0 refills | Status: DC

## 2019-09-12 ENCOUNTER — Ambulatory Visit (INDEPENDENT_AMBULATORY_CARE_PROVIDER_SITE_OTHER): Payer: BC Managed Care – PPO | Admitting: Family Medicine

## 2019-09-14 IMAGING — MG DIGITAL SCREENING BILATERAL MAMMOGRAM WITH TOMO AND CAD
6 of 10 series · 6 of 26 positions shown · non-contrast
Comparison: Previous exam(s).

CLINICAL DATA: Screening.

EXAM:
DIGITAL SCREENING BILATERAL MAMMOGRAM WITH TOMO AND CAD

[L CC synth-2D]
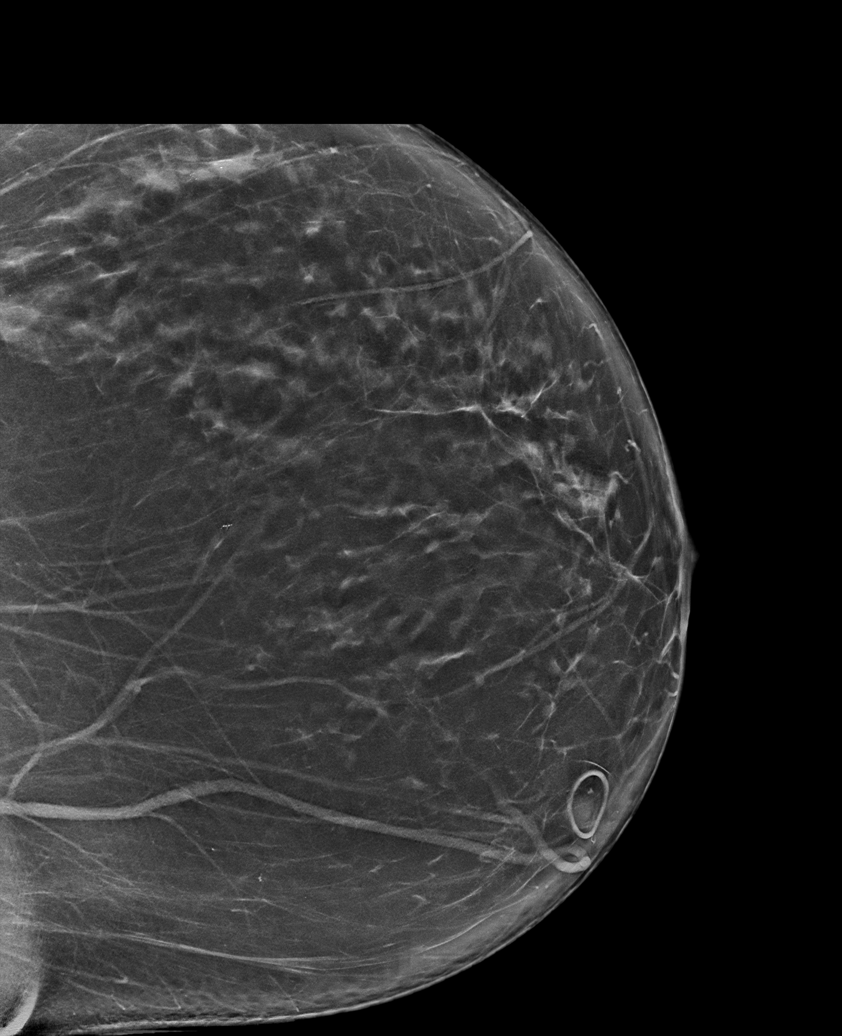

[R MLO synth-2D (1 of 2)]
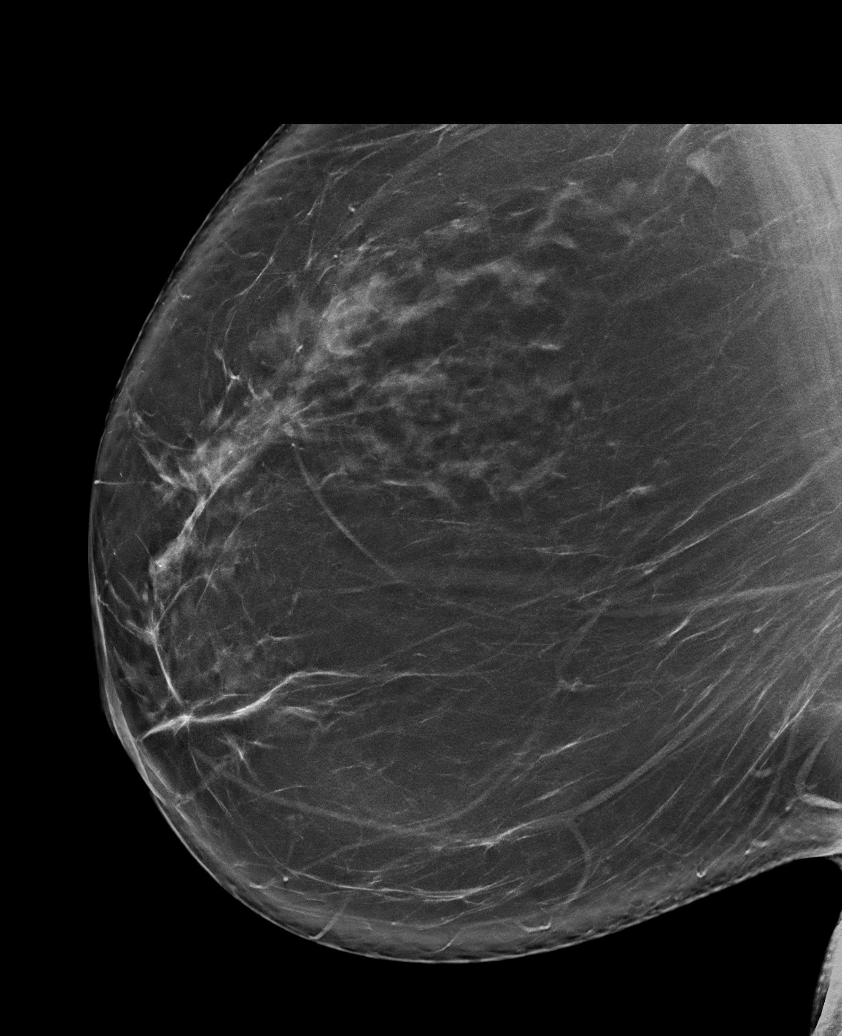

[R CC synth-2D]
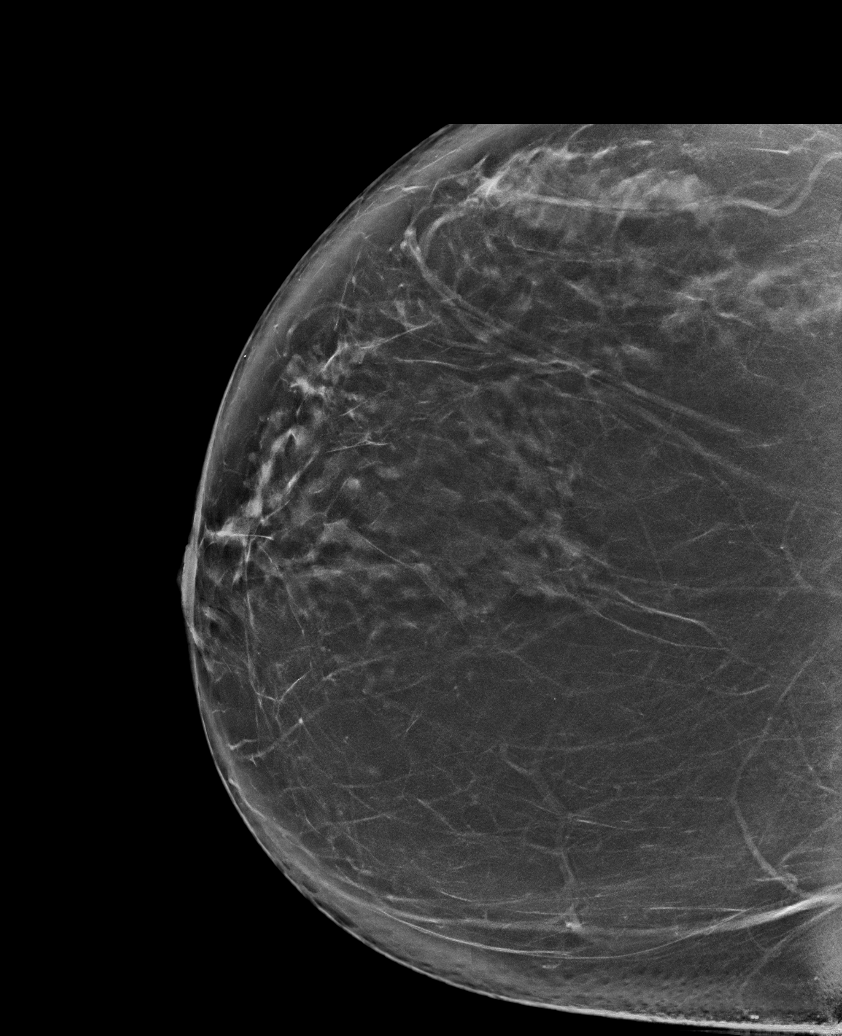

[L MLO synth-2D (1 of 2)]
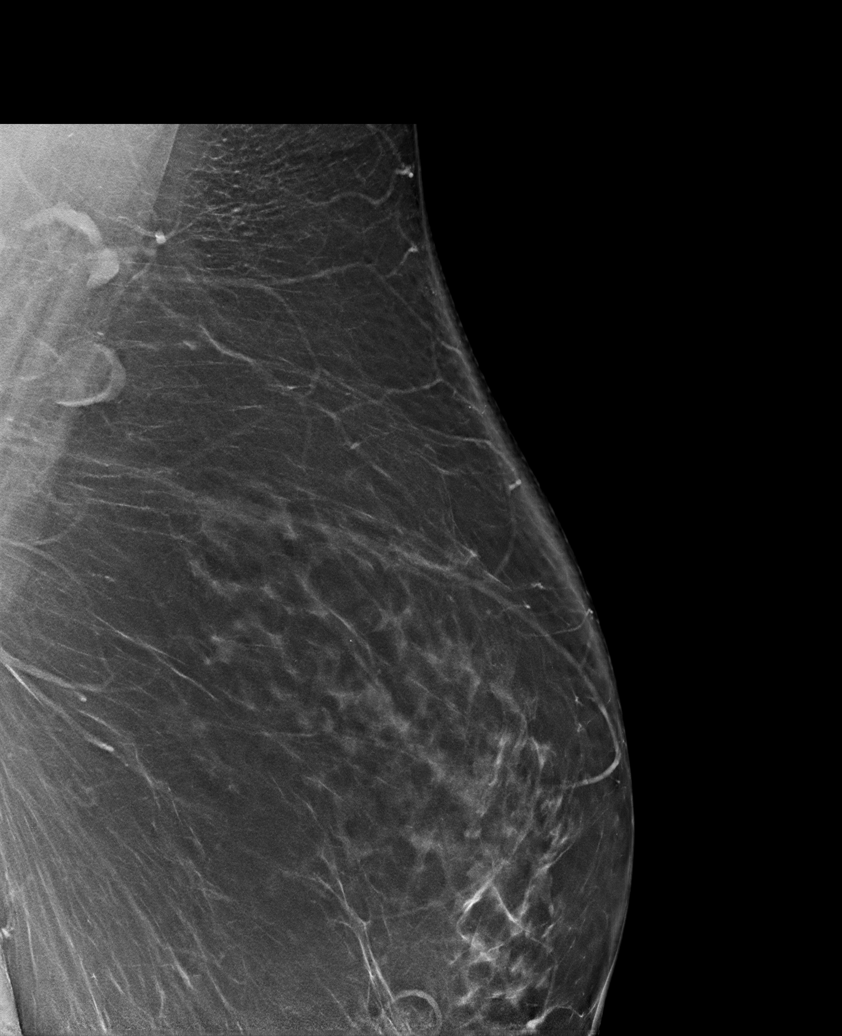

[R MLO synth-2D (2 of 2)]
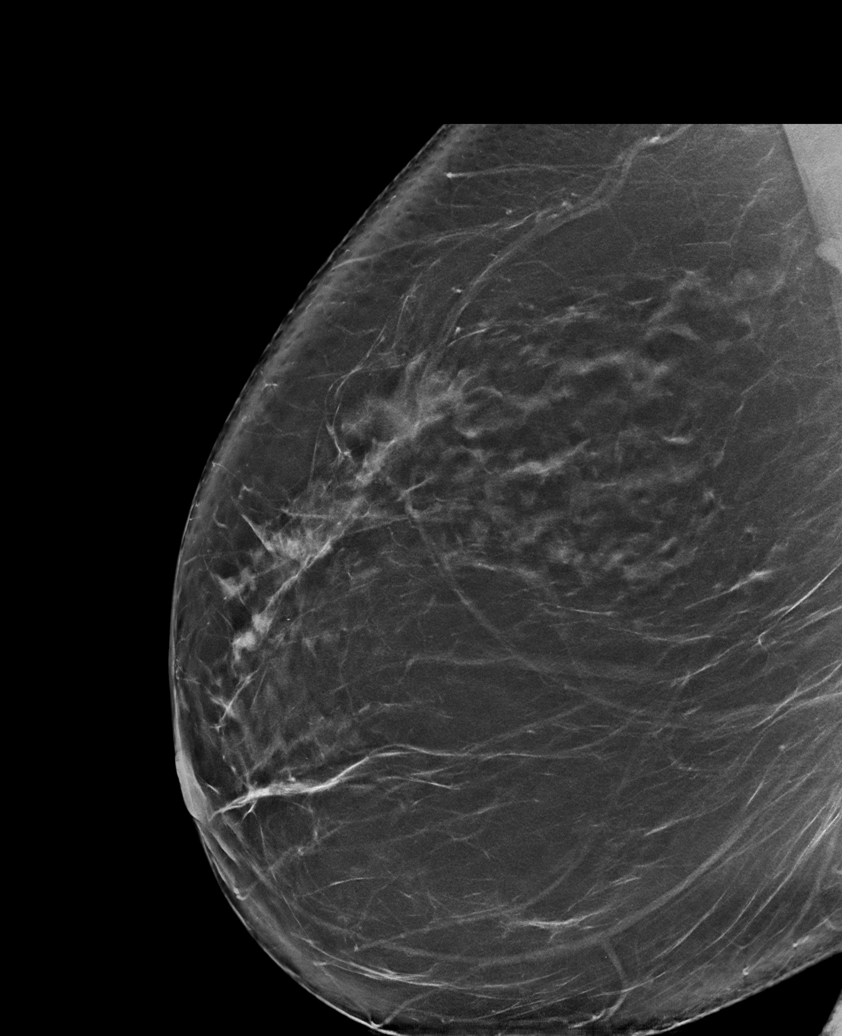

[L MLO synth-2D (2 of 2)]
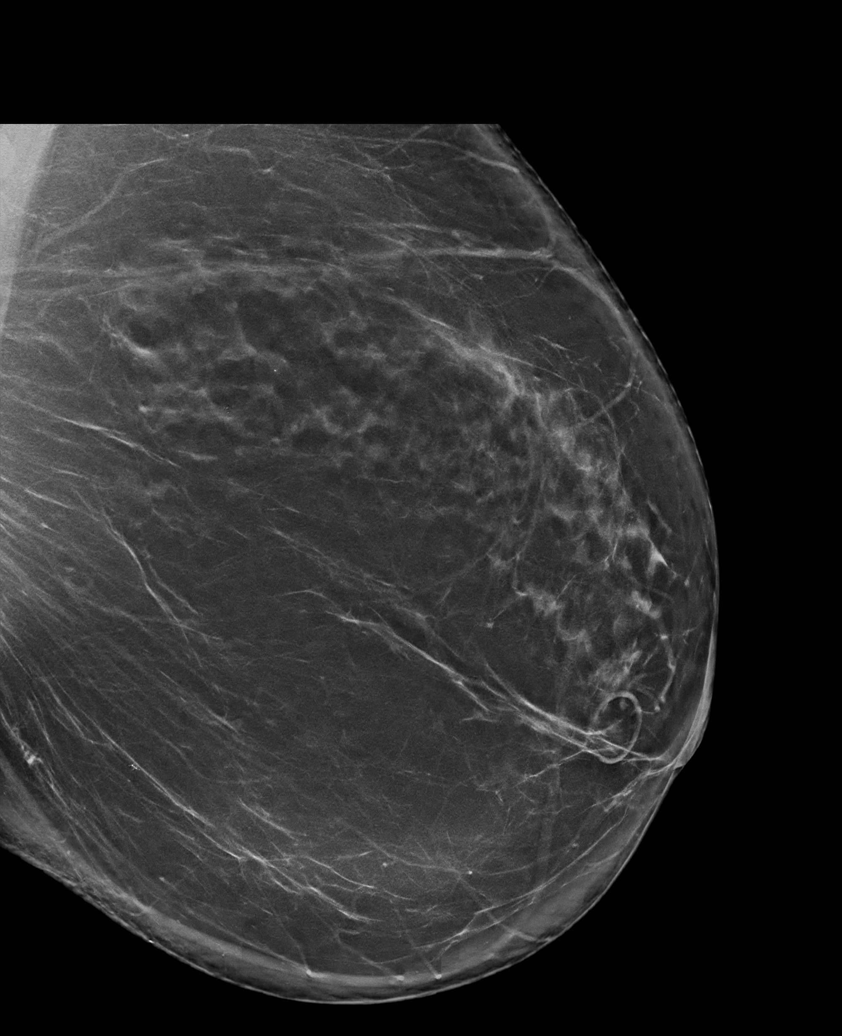

[6 of 26 positions shown; findings below may reference images not displayed]

ACR Breast Density Category b: There are scattered areas of
fibroglandular density.
FINDINGS: There are no findings suspicious for malignancy. Images were
processed with CAD.
IMPRESSION: No mammographic evidence of malignancy. A result letter of this
screening mammogram will be mailed directly to the patient.

RECOMMENDATION:
Screening mammogram in one year. (Code:[id number])

BI-RADS CATEGORY  1: Negative.

## 2019-09-16 ENCOUNTER — Other Ambulatory Visit (INDEPENDENT_AMBULATORY_CARE_PROVIDER_SITE_OTHER): Payer: Self-pay | Admitting: Family Medicine

## 2019-09-16 ENCOUNTER — Other Ambulatory Visit (INDEPENDENT_AMBULATORY_CARE_PROVIDER_SITE_OTHER): Payer: Self-pay | Admitting: Bariatrics

## 2019-09-16 DIAGNOSIS — F3289 Other specified depressive episodes: Secondary | ICD-10-CM

## 2019-09-16 DIAGNOSIS — E7849 Other hyperlipidemia: Secondary | ICD-10-CM

## 2019-09-18 ENCOUNTER — Other Ambulatory Visit (INDEPENDENT_AMBULATORY_CARE_PROVIDER_SITE_OTHER): Payer: Self-pay

## 2019-09-18 DIAGNOSIS — E7849 Other hyperlipidemia: Secondary | ICD-10-CM

## 2019-09-18 DIAGNOSIS — F3289 Other specified depressive episodes: Secondary | ICD-10-CM

## 2019-09-18 MED ORDER — BUPROPION HCL ER (SR) 150 MG PO TB12: 150.0000 mg | ORAL_TABLET | Freq: Every day | ORAL | 0 refills | Status: DC

## 2019-09-18 MED ORDER — ATORVASTATIN CALCIUM 10 MG PO TABS: 10.0000 mg | ORAL_TABLET | Freq: Every day | ORAL | 0 refills | Status: DC

## 2019-09-24 ENCOUNTER — Ambulatory Visit (INDEPENDENT_AMBULATORY_CARE_PROVIDER_SITE_OTHER): Payer: BC Managed Care – PPO | Admitting: Family Medicine

## 2019-10-04 ENCOUNTER — Encounter (INDEPENDENT_AMBULATORY_CARE_PROVIDER_SITE_OTHER): Payer: Self-pay

## 2019-10-08 ENCOUNTER — Ambulatory Visit (INDEPENDENT_AMBULATORY_CARE_PROVIDER_SITE_OTHER): Payer: BC Managed Care – PPO | Admitting: Family Medicine

## 2019-10-12 ENCOUNTER — Other Ambulatory Visit (INDEPENDENT_AMBULATORY_CARE_PROVIDER_SITE_OTHER): Payer: Self-pay | Admitting: Bariatrics

## 2019-10-12 DIAGNOSIS — F3289 Other specified depressive episodes: Secondary | ICD-10-CM

## 2019-10-12 DIAGNOSIS — E7849 Other hyperlipidemia: Secondary | ICD-10-CM

## 2019-10-16 ENCOUNTER — Other Ambulatory Visit (INDEPENDENT_AMBULATORY_CARE_PROVIDER_SITE_OTHER): Payer: Self-pay | Admitting: Bariatrics

## 2019-10-16 ENCOUNTER — Other Ambulatory Visit (INDEPENDENT_AMBULATORY_CARE_PROVIDER_SITE_OTHER): Payer: Self-pay | Admitting: Family Medicine

## 2019-10-16 DIAGNOSIS — F3289 Other specified depressive episodes: Secondary | ICD-10-CM

## 2019-10-16 DIAGNOSIS — E559 Vitamin D deficiency, unspecified: Secondary | ICD-10-CM

## 2019-10-18 ENCOUNTER — Other Ambulatory Visit (INDEPENDENT_AMBULATORY_CARE_PROVIDER_SITE_OTHER): Payer: Self-pay | Admitting: Bariatrics

## 2019-10-18 ENCOUNTER — Other Ambulatory Visit (INDEPENDENT_AMBULATORY_CARE_PROVIDER_SITE_OTHER): Payer: Self-pay | Admitting: Family Medicine

## 2019-10-18 DIAGNOSIS — E7849 Other hyperlipidemia: Secondary | ICD-10-CM

## 2019-10-18 DIAGNOSIS — F3289 Other specified depressive episodes: Secondary | ICD-10-CM

## 2019-10-18 DIAGNOSIS — E559 Vitamin D deficiency, unspecified: Secondary | ICD-10-CM

## 2019-10-18 MED ORDER — ATORVASTATIN CALCIUM 10 MG PO TABS: 10.0000 mg | ORAL_TABLET | Freq: Every day | ORAL | 0 refills | Status: DC

## 2019-10-18 MED ORDER — BUPROPION HCL ER (SR) 150 MG PO TB12: 150.0000 mg | ORAL_TABLET | Freq: Every day | ORAL | 0 refills | Status: DC

## 2019-10-18 MED ORDER — VITAMIN D (ERGOCALCIFEROL) 1.25 MG (50000 UNIT) PO CAPS: 50000.0000 [IU] | ORAL_CAPSULE | ORAL | 0 refills | Status: DC

## 2019-11-01 ENCOUNTER — Telehealth (INDEPENDENT_AMBULATORY_CARE_PROVIDER_SITE_OTHER): Payer: BC Managed Care – PPO | Admitting: Family Medicine

## 2019-11-01 ENCOUNTER — Encounter (INDEPENDENT_AMBULATORY_CARE_PROVIDER_SITE_OTHER): Payer: Self-pay | Admitting: Family Medicine

## 2019-11-01 ENCOUNTER — Other Ambulatory Visit: Payer: Self-pay

## 2019-11-01 DIAGNOSIS — E559 Vitamin D deficiency, unspecified: Secondary | ICD-10-CM

## 2019-11-01 DIAGNOSIS — Z6839 Body mass index (BMI) 39.0-39.9, adult: Secondary | ICD-10-CM

## 2019-11-01 DIAGNOSIS — F3289 Other specified depressive episodes: Secondary | ICD-10-CM

## 2019-11-01 MED ORDER — VITAMIN D (ERGOCALCIFEROL) 1.25 MG (50000 UNIT) PO CAPS: 50000.0000 [IU] | ORAL_CAPSULE | ORAL | 0 refills | Status: DC

## 2019-11-01 MED ORDER — BUPROPION HCL ER (SR) 200 MG PO TB12: 200.0000 mg | ORAL_TABLET | Freq: Every day | ORAL | 0 refills | Status: DC

## 2019-11-05 NOTE — Progress Notes (Signed)
Office: [phone]  /  Fax: [phone] TeleHealth Visit:  Christy [name] has verbally consented to this TeleHealth visit today. The patient is located at home, the provider is located at the [employer] [facility] office. The participants in this visit include the listed provider and patient and any and all parties involved. The visit was conducted today via FaceTime.  HPI:   Chief Complaint: OBESITY Christy [name] is here to discuss her progress with her obesity treatment plan. She is on the keep a food journal with 1200 to 1300 calories and 40 grams of protein daily plan and she is following her eating plan approximately 40 % of the time. She states she is exercising 0 minutes 0 times per week. Christy [name] reports gaining 6 pounds since our last visit, and her weight is 274 pounds (10/25/19). She reports journaling is not going well and she feels it gives her too much freedom. She tends to fall off track after lunch. Christy [name] would like to go back to the Category 2 plan. Christy [name] is using [employer] meal delivery which she is able to tailor to meet our category plan. . We were unable to weigh the patient today for this TeleHealth visit. She feels as if she has gained weight since her last visit. She has lost 6 lbs since starting treatment with [name].  Vitamin D deficiency Christy [name] has a diagnosis of vitamin D deficiency. Her last vitamin D level was low at 27.3 (06/28/19). She is on prescription vit D 50,000 IU weekly. Christy [name] denies nausea, vomiting or muscle weakness.  Depression with emotional eating behaviors Christy [name] reports stable mood but she voices that she has high stress at work. She works at an [employer]. Christy [name] does admit to quite a bit of stress eating.  She shows no sign of suicidal or homicidal ideations.  ASSESSMENT AND PLAN:  Vitamin D deficiency - Plan: Vitamin D, Ergocalciferol, (DRISDOL) 1.25 MG (50000 UT) CAPS capsule  Other depression - with emotional eating - Plan: buPROPion (WELLBUTRIN  SR) 200 MG 12 hr tablet  Class 2 severe obesity with serious comorbidity and body mass index (BMI) of 39.0 to 39.9 in adult, unspecified obesity type ([place])  PLAN:  Vitamin D Deficiency Christy [name] was informed that low vitamin D levels contributes to fatigue and are associated with obesity, breast, and colon cancer. Kizzy agrees to continue to take prescription Vit D @50 ,000 IU every week #4 with no refills and she will follow up for routine testing of vitamin D, at least 2-3 times per year. She was informed of the risk of over-replacement of vitamin D and agrees to not increase her dose unless she discusses this with [name] first. Christy [name] agrees to follow up with our clinic in 4 weeks.  Depression with Emotional Eating Behaviors We discussed behavior modification techniques today to help Christy [name] deal with her emotional eating and depression. Christy [name] agrees to increase the dose of Bupropion to 200 mg qAM #30 with no refills and follow up as directed.  Obesity Christy [name] is currently in the action stage of change. As such, her goal is to continue with weight loss efforts She has agreed to switch back to the Category 2 plan. We discussed the following Behavioral Modification Strategies today: planning for success, work on meal planning and easy cooking plans and emotional eating strategies  Handouts for holiday recipes were sent to patient via [place].  Christy [name] has agreed to follow up with our clinic in 4 weeks. She was informed of the importance of  frequent follow up visits to maximize her success with intensive lifestyle modifications for her multiple health conditions.  ALLERGIES: No Known Allergies  MEDICATIONS: Current Outpatient Medications on File Prior to Visit  Medication Sig Dispense Refill  . atorvastatin (LIPITOR) 10 MG tablet Take 1 tablet (10 mg total) by mouth daily. 15 tablet 0  . levothyroxine (SYNTHROID) 150 MCG tablet Take 1 tablet (150 mcg total) by mouth daily before breakfast. 90 tablet 0   . metFORMIN (GLUCOPHAGE) 500 MG tablet Take 1 tablet (500 mg total) by mouth daily with breakfast. 30 tablet 0   No current facility-administered medications on file prior to visit.     PAST MEDICAL HISTORY: Past Medical History:  Diagnosis Date  . Back pain   . Fatigue   . Hypothyroidism   . Infertility, female     PAST SURGICAL HISTORY: History reviewed. No pertinent surgical history.  SOCIAL HISTORY: Social History   Tobacco Use  . Smoking status: Never Smoker  . Smokeless tobacco: Never Used  Substance Use Topics  . Alcohol use: No    Frequency: Never  . Drug use: No    FAMILY HISTORY: Family History  Problem Relation Age of Onset  . Diabetes Mother   . Obesity Mother   . Cancer Father   . Breast cancer Maternal Grandmother     ROS: Review of Systems  Constitutional: Negative for weight loss.  Gastrointestinal: Negative for nausea and vomiting.  Musculoskeletal:       Negative for muscle weakness  Psychiatric/Behavioral: Positive for depression. Negative for suicidal ideas.       Positive for Stress    PHYSICAL EXAM: Pt in no acute distress  RECENT LABS AND TESTS: BMET    Component Value Date/Time   NA 141 06/28/2019 0933   K 4.5 06/28/2019 0933   CL 105 06/28/2019 0933   CO2 21 06/28/2019 0933   GLUCOSE 99 06/28/2019 0933   BUN 14 06/28/2019 0933   CREATININE 0.93 06/28/2019 0933   CALCIUM 9.3 06/28/2019 0933   GFRNONAA 70 06/28/2019 0933   GFRAA 81 06/28/2019 0933   Lab Results  Component Value Date   HGBA1C 5.5 06/28/2019   HGBA1C 5.5 09/04/2018   HGBA1C 5.8 (H) 04/03/2018   HGBA1C 5.7 (H) 12/21/2017   Lab Results  Component Value Date   INSULIN 9.3 06/28/2019   INSULIN 5.7 09/04/2018   INSULIN 10.1 04/03/2018   INSULIN 14.4 12/21/2017   CBC    Component Value Date/Time   WBC 6.5 12/21/2017 1057   RBC 4.41 12/21/2017 1057   HGB 12.9 12/21/2017 1057   HCT 39.8 12/21/2017 1057   MCV 90 12/21/2017 1057   MCH 29.3 12/21/2017  1057   MCHC 32.4 12/21/2017 1057   RDW 14.7 12/21/2017 1057   LYMPHSABS 1.9 12/21/2017 1057   EOSABS 0.3 12/21/2017 1057   BASOSABS 0.0 12/21/2017 1057   Iron/TIBC/Ferritin/ %Sat No results found for: IRON, TIBC, FERRITIN, IRONPCTSAT Lipid Panel     Component Value Date/Time   CHOL 183 06/28/2019 0933   TRIG 130 06/28/2019 0933   HDL 43 06/28/2019 0933   LDLCALC 114 (H) 06/28/2019 0933   Hepatic Function Panel     Component Value Date/Time   PROT 6.7 06/28/2019 0933   ALBUMIN 4.7 06/28/2019 0933   AST 19 06/28/2019 0933   ALT 14 06/28/2019 0933   ALKPHOS 79 06/28/2019 0933   BILITOT 0.5 06/28/2019 0933      Component Value Date/Time   TSH 1.870  09/04/2018 0802   TSH 1.040 04/03/2018 0812   TSH 0.981 12/21/2017 1057     Ref. Range 06/28/2019 09:33  Vitamin D, 25-Hydroxy Latest Ref Range: 30.0 - 100.0 ng/mL 27.3 (L)   I, [place] Crane, am acting as Energy manager for [place], FNP-C.  I have reviewed the above documentation for accuracy and completeness, and I agree with the above.  -  , FNP-C.

## 2019-11-27 ENCOUNTER — Telehealth (INDEPENDENT_AMBULATORY_CARE_PROVIDER_SITE_OTHER): Payer: Self-pay | Admitting: Family Medicine

## 2019-12-01 ENCOUNTER — Other Ambulatory Visit (INDEPENDENT_AMBULATORY_CARE_PROVIDER_SITE_OTHER): Payer: Self-pay | Admitting: Family Medicine

## 2019-12-01 ENCOUNTER — Other Ambulatory Visit (INDEPENDENT_AMBULATORY_CARE_PROVIDER_SITE_OTHER): Payer: Self-pay | Admitting: Bariatrics

## 2019-12-01 DIAGNOSIS — F3289 Other specified depressive episodes: Secondary | ICD-10-CM

## 2019-12-01 DIAGNOSIS — E559 Vitamin D deficiency, unspecified: Secondary | ICD-10-CM

## 2019-12-01 DIAGNOSIS — E038 Other specified hypothyroidism: Secondary | ICD-10-CM

## 2019-12-01 DIAGNOSIS — E7849 Other hyperlipidemia: Secondary | ICD-10-CM

## 2019-12-03 ENCOUNTER — Other Ambulatory Visit (INDEPENDENT_AMBULATORY_CARE_PROVIDER_SITE_OTHER): Payer: Self-pay | Admitting: Family Medicine

## 2019-12-03 ENCOUNTER — Other Ambulatory Visit (INDEPENDENT_AMBULATORY_CARE_PROVIDER_SITE_OTHER): Payer: Self-pay | Admitting: Bariatrics

## 2019-12-03 DIAGNOSIS — E559 Vitamin D deficiency, unspecified: Secondary | ICD-10-CM

## 2019-12-03 DIAGNOSIS — F3289 Other specified depressive episodes: Secondary | ICD-10-CM

## 2019-12-03 DIAGNOSIS — E038 Other specified hypothyroidism: Secondary | ICD-10-CM

## 2019-12-03 DIAGNOSIS — E7849 Other hyperlipidemia: Secondary | ICD-10-CM

## 2019-12-03 MED ORDER — BUPROPION HCL ER (SR) 200 MG PO TB12: 200.0000 mg | ORAL_TABLET | Freq: Every day | ORAL | 0 refills | Status: DC

## 2019-12-03 MED ORDER — ATORVASTATIN CALCIUM 10 MG PO TABS: 10.0000 mg | ORAL_TABLET | Freq: Every day | ORAL | 0 refills | Status: DC

## 2019-12-03 MED ORDER — LEVOTHYROXINE SODIUM 150 MCG PO TABS: 150.0000 ug | ORAL_TABLET | Freq: Every day | ORAL | 0 refills | Status: DC

## 2019-12-05 ENCOUNTER — Other Ambulatory Visit (INDEPENDENT_AMBULATORY_CARE_PROVIDER_SITE_OTHER): Payer: Self-pay | Admitting: Family Medicine

## 2019-12-05 ENCOUNTER — Encounter (INDEPENDENT_AMBULATORY_CARE_PROVIDER_SITE_OTHER): Payer: Self-pay

## 2019-12-05 DIAGNOSIS — E559 Vitamin D deficiency, unspecified: Secondary | ICD-10-CM

## 2019-12-14 ENCOUNTER — Other Ambulatory Visit (INDEPENDENT_AMBULATORY_CARE_PROVIDER_SITE_OTHER): Payer: Self-pay | Admitting: Family Medicine

## 2019-12-14 DIAGNOSIS — E7849 Other hyperlipidemia: Secondary | ICD-10-CM

## 2019-12-24 ENCOUNTER — Encounter (INDEPENDENT_AMBULATORY_CARE_PROVIDER_SITE_OTHER): Payer: Self-pay | Admitting: Family Medicine

## 2019-12-24 ENCOUNTER — Ambulatory Visit (INDEPENDENT_AMBULATORY_CARE_PROVIDER_SITE_OTHER): Payer: BC Managed Care – PPO | Admitting: Family Medicine

## 2019-12-24 ENCOUNTER — Other Ambulatory Visit: Payer: Self-pay

## 2019-12-24 ENCOUNTER — Telehealth (INDEPENDENT_AMBULATORY_CARE_PROVIDER_SITE_OTHER): Payer: BC Managed Care – PPO | Admitting: Family Medicine

## 2019-12-24 DIAGNOSIS — E559 Vitamin D deficiency, unspecified: Secondary | ICD-10-CM

## 2019-12-24 DIAGNOSIS — Z6839 Body mass index (BMI) 39.0-39.9, adult: Secondary | ICD-10-CM

## 2019-12-24 DIAGNOSIS — E7849 Other hyperlipidemia: Secondary | ICD-10-CM

## 2019-12-24 MED ORDER — VITAMIN D (ERGOCALCIFEROL) 1.25 MG (50000 UNIT) PO CAPS: 50000.0000 [IU] | ORAL_CAPSULE | ORAL | 0 refills | Status: DC

## 2019-12-24 MED ORDER — ATORVASTATIN CALCIUM 10 MG PO TABS: 10.0000 mg | ORAL_TABLET | Freq: Every day | ORAL | 0 refills | Status: DC

## 2019-12-26 ENCOUNTER — Telehealth (INDEPENDENT_AMBULATORY_CARE_PROVIDER_SITE_OTHER): Payer: BC Managed Care – PPO | Admitting: Family Medicine

## 2019-12-26 NOTE — Progress Notes (Addendum)
Office: [phone]  /  Fax: [phone] TeleHealth Visit:  Christy [name] has verbally consented to this TeleHealth visit today. The patient is located at work, the provider is located at the [employer] [facility] office. The participants in this visit include the listed provider and patient and any and all parties involved. The visit was conducted today via telephone (time spent on call 18 minutes, 3:19-3:37).  HPI:  Chief Complaint: OBESITY Christy [name] is here to discuss her progress with her obesity treatment plan. She is on the Category 2 Plan and states she is following her eating plan approximately 50 % of the time. She states she is doing low impact cardio exercise 45 minutes 3 to 4 times per week.  Christy [name] weight 276 pounds at home today, reflecting a weight gain. She feels the holidays have caused her to be off track. She cooks for her daughter, who is picky and her parents. She says that this makes it challenging to plan dinners.  Vitamin D Deficiency Christy [name] has a diagnosis of vitamin D deficiency. She is on prescription vitamin D. Her last vitamin D level was at 27.3 on 06/28/19 and was not at goal.  Hyperlipidemia Christy [name] has a diagnosis of hyperlipidemia. Her LDL is slightly above goal at 114, HDL is low at 43, and triglycerides are 130 (normal).  ASSESSMENT AND PLAN:  Vitamin D deficiency - Plan: Vitamin D, Ergocalciferol, (DRISDOL) 1.25 MG (50000 UT) CAPS capsule  Other hyperlipidemia - Plan: atorvastatin (LIPITOR) 10 MG tablet  Class 2 severe obesity with serious comorbidity and body mass index (BMI) of 39.0 to 39.9 in adult, unspecified obesity type ([facility])  PLAN:  Vitamin D Deficiency Low Vitamin D level contributes to fatigue and are associated with obesity, breast, and colon cancer. Christy [name] agrees to continue to take prescription Vitamin D @50 ,000 IU every week #4 with no refills and she will follow-up for routine testing of vitamin D, at least 2-3 times per year to avoid  over-replacement. Christy [name] agrees to follow up with our clinic in 2 to 3 weeks.  Hyperlipidemia This problem is chronic.   We discussed several lifestyle modifications today and Christy [name] will continue to work on diet, exercise and weight loss efforts. Christy [name] agrees to continue Atorvastatin 10 mg daily #30 with no refills and follow up as directed. Orders and follow up as documented in patient record.    Obesity Christy [name] is currently in the action stage of change. As such, her goal is to continue with weight loss efforts. She has agreed to Category 2 Plan. Christy [name] has been instructed to will continue current exercise regimen for weight loss and overall health benefits.. We discussed the following Behavioral Modification Strategies today: meal planning and cooking strategies and planning for success.  Christy [name] has agreed to follow-up with our clinic in 2 to 3 weeks. She was informed of the importance of frequent follow-up visits to maximize her success with intensive lifestyle modifications for her multiple health conditions.  ALLERGIES: No Known Allergies  MEDICATIONS: Current Outpatient Medications on File Prior to Visit  Medication Sig Dispense Refill  . buPROPion (WELLBUTRIN SR) 200 MG 12 hr tablet Take 1 tablet (200 mg total) by mouth daily with breakfast. 30 tablet 0  . levothyroxine (SYNTHROID) 150 MCG tablet Take 1 tablet (150 mcg total) by mouth daily before breakfast. 90 tablet 0  . metFORMIN (GLUCOPHAGE) 500 MG tablet Take 1 tablet (500 mg total) by mouth daily with breakfast. 30 tablet 0   No current facility-administered medications  on file prior to visit.    PAST MEDICAL HISTORY: Past Medical History:  Diagnosis Date  . Back pain   . Fatigue   . Hypothyroidism   . Infertility, female     PAST SURGICAL HISTORY: History reviewed. No pertinent surgical history.  SOCIAL HISTORY: Social History   Tobacco Use  . Smoking status: Never Smoker  . Smokeless tobacco: Never Used    Substance Use Topics  . Alcohol use: No  . Drug use: No    FAMILY HISTORY: Family History  Problem Relation Age of Onset  . Diabetes Mother   . Obesity Mother   . Cancer Father   . Breast cancer Maternal Grandmother     ROS: Review of Systems  Constitutional: Negative for weight loss.    PHYSICAL EXAM: There were no vitals taken for this visit. There is no height or weight on file to calculate BMI. Physical Exam Constitutional:      General: She is not in acute distress.    Appearance: Normal appearance. She is well-developed.  Neurological:     Mental Status: She is alert and oriented to person, place, and time.  Psychiatric:        Mood and Affect: Mood normal.        Behavior: Behavior normal.     RECENT LABS AND TESTS: BMET    Component Value Date/Time   NA 141 06/28/2019 0933   K 4.5 06/28/2019 0933   CL 105 06/28/2019 0933   CO2 21 06/28/2019 0933   GLUCOSE 99 06/28/2019 0933   BUN 14 06/28/2019 0933   CREATININE 0.93 06/28/2019 0933   CALCIUM 9.3 06/28/2019 0933   GFRNONAA 70 06/28/2019 0933   GFRAA 81 06/28/2019 0933   Lab Results  Component Value Date   HGBA1C 5.5 06/28/2019   HGBA1C 5.5 09/04/2018   HGBA1C 5.8 (H) 04/03/2018   HGBA1C 5.7 (H) 12/21/2017   Lab Results  Component Value Date   INSULIN 9.3 06/28/2019   INSULIN 5.7 09/04/2018   INSULIN 10.1 04/03/2018   INSULIN 14.4 12/21/2017   CBC    Component Value Date/Time   WBC 6.5 12/21/2017 1057   RBC 4.41 12/21/2017 1057   HGB 12.9 12/21/2017 1057   HCT 39.8 12/21/2017 1057   MCV 90 12/21/2017 1057   MCH 29.3 12/21/2017 1057   MCHC 32.4 12/21/2017 1057   RDW 14.7 12/21/2017 1057   LYMPHSABS 1.9 12/21/2017 1057   EOSABS 0.3 12/21/2017 1057   BASOSABS 0.0 12/21/2017 1057   Iron/TIBC/Ferritin/ %Sat No results found for: IRON, TIBC, FERRITIN, IRONPCTSAT Lipid Panel     Component Value Date/Time   CHOL 183 06/28/2019 0933   TRIG 130 06/28/2019 0933   HDL 43 06/28/2019 0933    LDLCALC 114 (H) 06/28/2019 0933   Hepatic Function Panel     Component Value Date/Time   PROT 6.7 06/28/2019 0933   ALBUMIN 4.7 06/28/2019 0933   AST 19 06/28/2019 0933   ALT 14 06/28/2019 0933   ALKPHOS 79 06/28/2019 0933   BILITOT 0.5 06/28/2019 0933      Component Value Date/Time   TSH 1.870 09/04/2018 0802   TSH 1.040 04/03/2018 0812   TSH 0.981 12/21/2017 1057    Ref. Range 06/28/2019 09:33  Vitamin D, 25-Hydroxy Latest Ref Range: 30.0 - 100.0 ng/mL 27.3 (L)   I, [place] Crane, am acting as Energy manager for [place], FNP-C  I have reviewed the above documentation for accuracy and completeness, and I agree with the  above.  - Yamilee Harmes, FNP-C.

## 2019-12-29 ENCOUNTER — Other Ambulatory Visit (INDEPENDENT_AMBULATORY_CARE_PROVIDER_SITE_OTHER): Payer: Self-pay | Admitting: Family Medicine

## 2019-12-29 DIAGNOSIS — F3289 Other specified depressive episodes: Secondary | ICD-10-CM

## 2020-01-12 ENCOUNTER — Other Ambulatory Visit (INDEPENDENT_AMBULATORY_CARE_PROVIDER_SITE_OTHER): Payer: Self-pay | Admitting: Family Medicine

## 2020-01-12 DIAGNOSIS — F3289 Other specified depressive episodes: Secondary | ICD-10-CM

## 2020-01-14 ENCOUNTER — Other Ambulatory Visit (INDEPENDENT_AMBULATORY_CARE_PROVIDER_SITE_OTHER): Payer: Self-pay | Admitting: Family Medicine

## 2020-01-14 DIAGNOSIS — F3289 Other specified depressive episodes: Secondary | ICD-10-CM

## 2020-01-14 DIAGNOSIS — E559 Vitamin D deficiency, unspecified: Secondary | ICD-10-CM

## 2020-01-14 DIAGNOSIS — E7849 Other hyperlipidemia: Secondary | ICD-10-CM

## 2020-01-14 DIAGNOSIS — E8881 Metabolic syndrome: Secondary | ICD-10-CM

## 2020-01-14 MED ORDER — BUPROPION HCL ER (SR) 200 MG PO TB12: 200.0000 mg | ORAL_TABLET | Freq: Every day | ORAL | 0 refills | Status: DC

## 2020-01-29 ENCOUNTER — Encounter (INDEPENDENT_AMBULATORY_CARE_PROVIDER_SITE_OTHER): Payer: Self-pay | Admitting: Family Medicine

## 2020-01-29 ENCOUNTER — Telehealth (INDEPENDENT_AMBULATORY_CARE_PROVIDER_SITE_OTHER): Payer: BC Managed Care – PPO | Admitting: Family Medicine

## 2020-01-29 ENCOUNTER — Other Ambulatory Visit: Payer: Self-pay

## 2020-01-29 DIAGNOSIS — F3289 Other specified depressive episodes: Secondary | ICD-10-CM | POA: Diagnosis not present

## 2020-01-29 DIAGNOSIS — E559 Vitamin D deficiency, unspecified: Secondary | ICD-10-CM

## 2020-01-29 DIAGNOSIS — Z6839 Body mass index (BMI) 39.0-39.9, adult: Secondary | ICD-10-CM | POA: Diagnosis not present

## 2020-01-29 MED ORDER — BUPROPION HCL ER (SR) 200 MG PO TB12: 200.0000 mg | ORAL_TABLET | Freq: Every day | ORAL | 0 refills | Status: DC

## 2020-01-29 MED ORDER — VITAMIN D (ERGOCALCIFEROL) 1.25 MG (50000 UNIT) PO CAPS: 50000.0000 [IU] | ORAL_CAPSULE | ORAL | 0 refills | Status: DC

## 2020-01-30 NOTE — Progress Notes (Signed)
TeleHealth Visit:  Due to the COVID-19 pandemic, this visit was completed with telemedicine (audio/video) technology to reduce patient and provider exposure as well as to preserve personal protective equipment.   Christy [name] has verbally consented to this TeleHealth visit. The patient is located in her car driving home, the provider is located at the [facility] office. The participants in this visit include the listed provider and patient and any and all parties involved.   Christy [name] was unable to use realtime audiovisual technology today and the telehealth visit was conducted via telephone.  Chief Complaint: OBESITY Christy [name] is here to discuss her progress with her obesity treatment plan along with follow-up of her obesity related diagnoses. Petrina is on the Category 2 Plan and states she is following her eating plan approximately 50% of the time. Joe states she is aerobics 30 minutes 2 to 3 times per week.  Today's visit was #: 22 Starting weight: 273 lbs Starting date: 12/21/2017  Interim History: Christy [name] reports she has maintained her weight which is 274 pounds today (01/28/20). She admits to eating more take-out over the last few weeks. She does use "[employer]". She is on the plan for breakfast and lunch. She tends to snack after dinner also.  Subjective:   Vitamin D deficiency  Christy [name]'s Vitamin D level was 27.3 on 06/28/19 and was not at goal. She is currently taking vit D.   Other depression - with emotional eating  Christy [name] does report some eating that is not due to hunger, especially after dinner. She cares for her parents and she has a teenage daughter, and meal plans for everyone.   Assessment/Plan:   Vitamin D deficiency Low Vitamin D level contributes to fatigue and are associated with obesity, breast, and colon cancer. Christy [name] agrees to continue to take prescription Vitamin D @50 ,000 IU every week #4 with no refills and we will check vitamin D level at the next visit.  She will follow-up for routine testing of Vitamin D, at least 2-3 times per year to avoid over-replacement.  Other depression - with emotional eating   Christy [name] agrees to continue buPROPion (WELLBUTRIN SR) 200 MG 12 hr tablet qAM #30 with no refills. Orders and follow up as documented in patient record.   Class 2 severe obesity with serious comorbidity and body mass index (BMI) of 39.0 to 39.9 in adult, unspecified obesity type [facility]) Christy [name] is currently in the action stage of change. As such, her goal is to continue with weight loss efforts. She has agreed to the Category 2 Plan or the lower carbohydrate, vegetable and lean protein rich diet plan.   Exercise goals: Christy [name] will continue her current exercise regimen. We discussed doing exercise videos that combine cardio and resistance  Behavioral modification strategies: decreasing simple carbohydrates, keeping healthy foods in the home and planning for success. Handout sent via MyChart: Low Carb plan. Christy [name] will review and she may start the low carb plan.  Christy [name] has agreed to follow-up with our clinic in 3 weeks. She was informed of the importance of frequent follow-up visits to maximize her success with intensive lifestyle modifications for her multiple health conditions.  Objective:   VITALS: Per patient if applicable, see vitals. GENERAL: Alert and in no acute distress. CARDIOPULMONARY: No increased WOB. Speaking in clear sentences.  PSYCH: Pleasant and cooperative. Speech normal rate and rhythm. Affect is appropriate. Insight and judgement are appropriate. Attention is focused, linear, and appropriate.  NEURO: Oriented as arrived to appointment  on time with no prompting.   Lab Results  Component Value Date   CREATININE 0.93 06/28/2019   BUN 14 06/28/2019   NA 141 06/28/2019   K 4.5 06/28/2019   CL 105 06/28/2019   CO2 21 06/28/2019   Lab Results  Component Value Date   ALT 14 06/28/2019   AST 19 06/28/2019   ALKPHOS 79 06/28/2019     BILITOT 0.5 06/28/2019   Lab Results  Component Value Date   HGBA1C 5.5 06/28/2019   HGBA1C 5.5 09/04/2018   HGBA1C 5.8 (H) 04/03/2018   HGBA1C 5.7 (H) 12/21/2017   Lab Results  Component Value Date   INSULIN 9.3 06/28/2019   INSULIN 5.7 09/04/2018   INSULIN 10.1 04/03/2018   INSULIN 14.4 12/21/2017   Lab Results  Component Value Date   TSH 1.870 09/04/2018   Lab Results  Component Value Date   CHOL 183 06/28/2019   HDL 43 06/28/2019   LDLCALC 114 (H) 06/28/2019   TRIG 130 06/28/2019   Lab Results  Component Value Date   WBC 6.5 12/21/2017   HGB 12.9 12/21/2017   HCT 39.8 12/21/2017   MCV 90 12/21/2017   No results found for: IRON, TIBC, FERRITIN   Ref. Range 06/28/2019 09:33  Vitamin D, 25-Hydroxy Latest Ref Range: 30.0 - 100.0 ng/mL 27.3 (L)   Attestation Statements:   Reviewed by clinician on day of visit: allergies, medications, problem list, medical history, surgical history, family history, social history, and previous encounter notes.  Time spent on visit including pre-visit chart review and post-visit care was 16 minutes (11:56-12:12 PM).   Cristi Loron, am acting as Energy manager for [place], FNP-C.  I have reviewed the above documentation for accuracy and completeness, and I agree with the above. - Shaneya Taketa [employer], FNP-C

## 2020-02-14 ENCOUNTER — Ambulatory Visit: Payer: BC Managed Care – PPO | Attending: Internal Medicine

## 2020-02-14 DIAGNOSIS — Z23 Encounter for immunization: Secondary | ICD-10-CM | POA: Insufficient documentation

## 2020-02-14 NOTE — Progress Notes (Signed)
   Covid-19 Vaccination Clinic  Name:  Christy [name]    MRN: [id number] DOB: 05/31/1966  02/14/2020  [name] was observed post Covid-19 immunization for 15 minutes without incidence. She was provided with Vaccine Information Sheet and instruction to access the V-Safe system.   [name] was instructed to call 911 with any severe reactions post vaccine: [place] Kitchen Difficulty breathing  . Swelling of your face and throat  . A fast heartbeat  . A bad rash all over your body  . Dizziness and weakness    Immunizations Administered    Name Date Dose VIS Date Route   Pfizer COVID-19 Vaccine 02/14/2020 12:22 PM 0.3 mL 11/30/2019 Intramuscular   Manufacturer: [employer], [employer]   Lot: [id number]   NDC: [address]-0

## 2020-02-19 ENCOUNTER — Ambulatory Visit (INDEPENDENT_AMBULATORY_CARE_PROVIDER_SITE_OTHER): Payer: BC Managed Care – PPO | Admitting: Family Medicine

## 2020-02-24 ENCOUNTER — Other Ambulatory Visit (INDEPENDENT_AMBULATORY_CARE_PROVIDER_SITE_OTHER): Payer: Self-pay | Admitting: Family Medicine

## 2020-02-24 DIAGNOSIS — E038 Other specified hypothyroidism: Secondary | ICD-10-CM

## 2020-02-24 DIAGNOSIS — E7849 Other hyperlipidemia: Secondary | ICD-10-CM

## 2020-02-25 MED ORDER — ATORVASTATIN CALCIUM 10 MG PO TABS: 10.0000 mg | ORAL_TABLET | Freq: Every day | ORAL | 0 refills | Status: DC

## 2020-02-25 MED ORDER — LEVOTHYROXINE SODIUM 150 MCG PO TABS: 150.0000 ug | ORAL_TABLET | Freq: Every day | ORAL | 0 refills | Status: DC

## 2020-02-26 ENCOUNTER — Other Ambulatory Visit (INDEPENDENT_AMBULATORY_CARE_PROVIDER_SITE_OTHER): Payer: Self-pay | Admitting: Family Medicine

## 2020-02-26 DIAGNOSIS — E038 Other specified hypothyroidism: Secondary | ICD-10-CM

## 2020-02-28 ENCOUNTER — Ambulatory Visit (INDEPENDENT_AMBULATORY_CARE_PROVIDER_SITE_OTHER): Payer: BC Managed Care – PPO | Admitting: Bariatrics

## 2020-03-05 ENCOUNTER — Ambulatory Visit: Payer: BC Managed Care – PPO | Attending: Internal Medicine

## 2020-03-05 DIAGNOSIS — Z23 Encounter for immunization: Secondary | ICD-10-CM

## 2020-03-05 NOTE — Progress Notes (Signed)
   Covid-19 Vaccination Clinic  Name:  Christy [name]    MRN: [id number] DOB: [birth year]  03/05/2020  [name] was observed post Covid-19 immunization for 15 minutes without incident. She was provided with Vaccine Information Sheet and instruction to access the V-Safe system.   [name] was instructed to call 911 with any severe reactions post vaccine: [place] Kitchen Difficulty breathing  . Swelling of face and throat  . A fast heartbeat  . A bad rash all over body  . Dizziness and weakness   Immunizations Administered    Name Date Dose VIS Date Route   Pfizer COVID-19 Vaccine 03/05/2020  4:15 PM 0.3 mL 11/30/2019 Intramuscular   Manufacturer: [employer], [employer]   Lot: [id number]   NDC: [id number]

## 2020-03-19 ENCOUNTER — Ambulatory Visit (INDEPENDENT_AMBULATORY_CARE_PROVIDER_SITE_OTHER): Payer: BC Managed Care – PPO | Admitting: Family Medicine

## 2020-03-25 ENCOUNTER — Other Ambulatory Visit: Payer: Self-pay

## 2020-03-25 ENCOUNTER — Ambulatory Visit (INDEPENDENT_AMBULATORY_CARE_PROVIDER_SITE_OTHER): Payer: BC Managed Care – PPO | Admitting: Physician Assistant

## 2020-03-25 ENCOUNTER — Encounter (INDEPENDENT_AMBULATORY_CARE_PROVIDER_SITE_OTHER): Payer: Self-pay | Admitting: Physician Assistant

## 2020-03-25 VITALS — BP 120/86 | HR 78 | Temp 98.0°F | Ht 69.0 in | Wt 285.0 lb

## 2020-03-25 DIAGNOSIS — Z6841 Body Mass Index (BMI) 40.0 and over, adult: Secondary | ICD-10-CM

## 2020-03-25 DIAGNOSIS — R7303 Prediabetes: Secondary | ICD-10-CM

## 2020-03-25 DIAGNOSIS — E038 Other specified hypothyroidism: Secondary | ICD-10-CM | POA: Diagnosis not present

## 2020-03-25 DIAGNOSIS — F3289 Other specified depressive episodes: Secondary | ICD-10-CM

## 2020-03-25 DIAGNOSIS — Z9189 Other specified personal risk factors, not elsewhere classified: Secondary | ICD-10-CM

## 2020-03-25 DIAGNOSIS — E7849 Other hyperlipidemia: Secondary | ICD-10-CM | POA: Diagnosis not present

## 2020-03-25 DIAGNOSIS — E559 Vitamin D deficiency, unspecified: Secondary | ICD-10-CM | POA: Diagnosis not present

## 2020-03-25 MED ORDER — BUPROPION HCL ER (SR) 200 MG PO TB12: 200.0000 mg | ORAL_TABLET | Freq: Every day | ORAL | 0 refills | Status: DC

## 2020-03-25 NOTE — Progress Notes (Signed)
Chief Complaint:   OBESITY Christy [name] is here to discuss her progress with her obesity treatment plan along with follow-up of her obesity related diagnoses. Christy [name] is on the Category 2 Plan and states she is following her eating plan approximately 30% of the time. Christy [name] states she is exercising 0 minutes 0 times per week.  Today's visit was #: 23 Starting weight: 273 lbs Starting date: 12/21/2017 Today's weight: 285 lbs Today's date: 03/25/2020 Total lbs lost to date: 0 Total lbs lost since last in-office visit: 0  Interim History: Christy [name] reports that she has been off of her plan with COVID and has not been exercising. She notes that she has a very sedentary job.  Subjective:   Other hyperlipidemia. Christy [name] is on atorvastatin. No chest pain. No myalgias. She is due for labs.   Lab Results  Component Value Date   CHOL 183 06/28/2019   HDL 43 06/28/2019   LDLCALC 114 (H) 06/28/2019   TRIG 130 06/28/2019   Lab Results  Component Value Date   ALT 14 06/28/2019   AST 19 06/28/2019   ALKPHOS 79 06/28/2019   BILITOT 0.5 06/28/2019   The 10-year ASCVD risk score Denman George DC Jr., et al., 2013) is: 1.8%   Values used to calculate the score:     Age: 54 years     Sex: Female     Is Non-Hispanic African American: No     Diabetic: No     Tobacco smoker: No     Systolic Blood Pressure: 120 mmHg     Is BP treated: No     HDL Cholesterol: 43 mg/dL     Total Cholesterol: 183 mg/dL  Vitamin D deficiency. Christy [name] is on Vitamin D weekly. No nausea, vomiting, or muscle weakness. Last Vitamin D 27.3 on 06/28/2019. She is due for labs.  Prediabetes. Christy [name] has a diagnosis of prediabetes based on her elevated HgA1c and was informed this puts her at greater risk of developing diabetes. She continues to work on diet and exercise to decrease her risk of diabetes. She denies nausea or hypoglycemia. Christy [name] has been off of metformin for 6 months. She is due for labs.  Lab Results  Component Value  Date   HGBA1C 5.5 06/28/2019   Lab Results  Component Value Date   INSULIN 9.3 06/28/2019   INSULIN 5.7 09/04/2018   INSULIN 10.1 04/03/2018   INSULIN 14.4 12/21/2017   Other specified hypothyroidism. Christy [name] is on levothyroxine. No excessive fatigue or heat/cold intolerance. She is due for labs.   Lab Results  Component Value Date   TSH 1.870 09/04/2018   Other depression, with emotional eating. Christy [name] is struggling with emotional eating and using food for comfort to the extent that it is negatively impacting her health. She has been working on behavior modification techniques to help reduce her emotional eating and has been somewhat successful. She shows no sign of suicidal or homicidal ideations. Christy [name] is on bupropion. Blood pressure is normal.  At risk for diabetes mellitus. Christy [name] is at higher than average risk for developing diabetes due to her obesity.   Assessment/Plan:   Other hyperlipidemia. Cardiovascular risk and specific lipid/LDL goals reviewed.  We discussed several lifestyle modifications today and Christy [name] will continue to work on diet, exercise and weight loss efforts. Orders and follow up as documented in patient record. Christy [name] will continue her medication as directed. Comprehensive metabolic panel, Lipid Panel With LDL/HDL Ratio labs ordered today.  Counseling Intensive lifestyle modifications  are the first line treatment for this issue.  Dietary changes: Increase soluble fiber. Decrease simple carbohydrates.  Exercise changes: Moderate to vigorous-intensity aerobic activity 150 minutes per week if tolerated.  Lipid-lowering medications: see documented in medical record.    Vitamin D deficiency. Low Vitamin D level contributes to fatigue and are associated with obesity, breast, and colon cancer. She agrees to continue to take Vitamin D and VITAMIN D 25 Hydroxy (Vit-D Deficiency, Fractures) level ordered today.  Prediabetes. Christy [name] will continue to work on weight loss,  exercise, and decreasing simple carbohydrates to help decrease the risk of diabetes. Hemoglobin A1c, Insulin, random labs ordered today.  Other specified hypothyroidism. Patient with long-standing hypothyroidism, on levothyroxine therapy. She appears euthyroid. Orders and follow up as documented in patient record. She will continue her medication as directed. T3, T4, free, TSH levels ordered today.  Counseling  Good thyroid control is important for overall health. Supratherapeutic thyroid levels are dangerous and will not improve weight loss results.  The correct way to take levothyroxine is fasting, with water, separated by at least 30 minutes from breakfast, and separated by more than 4 hours from calcium, iron, multivitamins, acid reflux medications (PPIs).      Other depression, with emotional eating. Behavior modification techniques were discussed today to help Christy [name] deal with her emotional/non-hunger eating behaviors.  Orders and follow up as documented in patient record. Christy [name] was given a refill on her buPROPion (WELLBUTRIN SR) 200 MG 12 hr tablet #30 with 0 refills.  At risk for diabetes mellitus. Christy [name] was given approximately 15 minutes of diabetes education and counseling today. We discussed intensive lifestyle modifications today with an emphasis on weight loss as well as increasing exercise and decreasing simple carbohydrates in her diet. We also reviewed medication options with an emphasis on risk versus benefit of those discussed.   Repetitive spaced learning was employed today to elicit superior memory formation and behavioral change.  Class 3 severe obesity with serious comorbidity and body mass index (BMI) of 40.0 to 44.9 in adult, unspecified obesity type ([place]).  Christy [name] is currently in the action stage of change. As such, her goal is to continue with weight loss efforts. She has agreed to the Category 2 Plan.   Will have IC at her next visit.  Exercise goals: For substantial  health benefits, adults should do at least 150 minutes (2 hours and 30 minutes) a week of moderate-intensity, or 75 minutes (1 hour and 15 minutes) a week of vigorous-intensity aerobic physical activity, or an equivalent combination of moderate- and vigorous-intensity aerobic activity. Aerobic activity should be performed in episodes of at least 10 minutes, and preferably, it should be spread throughout the week.  Behavioral modification strategies: meal planning and cooking strategies and keeping healthy foods in the home.  Christy [name] has agreed to follow-up with our clinic in 2 weeks. She was informed of the importance of frequent follow-up visits to maximize her success with intensive lifestyle modifications for her multiple health conditions.   Christy [name] was informed we would discuss her lab results at her next visit unless there is a critical issue that needs to be addressed sooner. Christy [name] agreed to keep her next visit at the agreed upon time to discuss these results.  Objective:   Blood pressure 120/86, pulse 78, temperature 98 F (36.7 C), temperature source Oral, height 5\' 9"  (1.753 m), weight 285 lb (129.3 kg), SpO2 98 %. Body mass index is 42.09 kg/m.  General: Cooperative, alert, well developed, in no  acute distress. HEENT: Conjunctivae and lids unremarkable. Cardiovascular: Regular rhythm.  Lungs: Normal work of breathing. Neurologic: No focal deficits.   Lab Results  Component Value Date   CREATININE 0.93 06/28/2019   BUN 14 06/28/2019   NA 141 06/28/2019   K 4.5 06/28/2019   CL 105 06/28/2019   CO2 21 06/28/2019   Lab Results  Component Value Date   ALT 14 06/28/2019   AST 19 06/28/2019   ALKPHOS 79 06/28/2019   BILITOT 0.5 06/28/2019   Lab Results  Component Value Date   HGBA1C 5.5 06/28/2019   HGBA1C 5.5 09/04/2018   HGBA1C 5.8 (H) 04/03/2018   HGBA1C 5.7 (H) 12/21/2017   Lab Results  Component Value Date   INSULIN 9.3 06/28/2019   INSULIN 5.7 09/04/2018    INSULIN 10.1 04/03/2018   INSULIN 14.4 12/21/2017   Lab Results  Component Value Date   TSH 1.870 09/04/2018   Lab Results  Component Value Date   CHOL 183 06/28/2019   HDL 43 06/28/2019   LDLCALC 114 (H) 06/28/2019   TRIG 130 06/28/2019   Lab Results  Component Value Date   WBC 6.5 12/21/2017   HGB 12.9 12/21/2017   HCT 39.8 12/21/2017   MCV 90 12/21/2017   No results found for: IRON, TIBC, FERRITIN  Attestation Statements:   Reviewed by clinician on day of visit: allergies, medications, problem list, medical history, surgical history, family history, social history, and previous encounter notes.  IMarianna Payment, am acting as transcriptionist for Alois Cliche, PA-C   I have reviewed the above documentation for accuracy and completeness, and I agree with the above. Alois Cliche, PA-C

## 2020-03-26 LAB — HEMOGLOBIN A1C
Est. average glucose Bld gHb Est-mCnc: 111 mg/dL
Hgb A1c MFr Bld: 5.5 % (ref 4.8–5.6)

## 2020-03-26 LAB — LIPID PANEL WITH LDL/HDL RATIO
Cholesterol, Total: 216 mg/dL — ABNORMAL HIGH (ref 100–199)
HDL: 43 mg/dL (ref >39)
LDL Chol Calc (NIH): 146 mg/dL — ABNORMAL HIGH (ref 0–99)
LDL/HDL Ratio: 3.4 ratio — ABNORMAL HIGH (ref 0.0–3.2)
Triglycerides: 150 mg/dL — ABNORMAL HIGH (ref 0–149)
VLDL Cholesterol Cal: 27 mg/dL (ref 5–40)

## 2020-03-26 LAB — COMPREHENSIVE METABOLIC PANEL
ALT: 13 IU/L (ref 0–32)
AST: 15 IU/L (ref 0–40)
Albumin/Globulin Ratio: 1.8 (ref 1.2–2.2)
Albumin: 4.3 g/dL (ref 3.8–4.9)
Alkaline Phosphatase: 80 IU/L (ref 39–117)
BUN/Creatinine Ratio: 20 (ref 9–23)
BUN: 17 mg/dL (ref 6–24)
Bilirubin Total: 0.4 mg/dL (ref 0.0–1.2)
CO2: 20 mmol/L (ref 20–29)
Calcium: 9 mg/dL (ref 8.7–10.2)
Chloride: 104 mmol/L (ref 96–106)
Creatinine, Ser: 0.85 mg/dL (ref 0.57–1.00)
GFR calc Af Amer: 90 mL/min/{1.73_m2} (ref >59)
GFR calc non Af Amer: 78 mL/min/{1.73_m2} (ref >59)
Globulin, Total: 2.4 g/dL (ref 1.5–4.5)
Glucose: 110 mg/dL — ABNORMAL HIGH (ref 65–99)
Potassium: 4.3 mmol/L (ref 3.5–5.2)
Sodium: 140 mmol/L (ref 134–144)
Total Protein: 6.7 g/dL (ref 6.0–8.5)

## 2020-03-26 LAB — INSULIN, RANDOM: INSULIN: 16.7 u[IU]/mL (ref 2.6–24.9)

## 2020-03-26 LAB — TSH: TSH: 10.4 u[IU]/mL — ABNORMAL HIGH (ref 0.450–4.500)

## 2020-03-26 LAB — T4, FREE: Free T4: 1.36 ng/dL (ref 0.82–1.77)

## 2020-03-26 LAB — VITAMIN D 25 HYDROXY (VIT D DEFICIENCY, FRACTURES): Vit D, 25-Hydroxy: 27.6 ng/mL — ABNORMAL LOW (ref 30.0–100.0)

## 2020-03-26 LAB — T3: T3, Total: 81 ng/dL (ref 71–180)

## 2020-03-29 ENCOUNTER — Encounter (INDEPENDENT_AMBULATORY_CARE_PROVIDER_SITE_OTHER): Payer: Self-pay | Admitting: Physician Assistant

## 2020-03-31 NOTE — Telephone Encounter (Signed)
Please advise 

## 2020-04-06 ENCOUNTER — Encounter (INDEPENDENT_AMBULATORY_CARE_PROVIDER_SITE_OTHER): Payer: Self-pay | Admitting: Physician Assistant

## 2020-04-07 ENCOUNTER — Encounter (INDEPENDENT_AMBULATORY_CARE_PROVIDER_SITE_OTHER): Payer: Self-pay

## 2020-04-09 ENCOUNTER — Ambulatory Visit (INDEPENDENT_AMBULATORY_CARE_PROVIDER_SITE_OTHER): Payer: BC Managed Care – PPO | Admitting: Physician Assistant

## 2020-04-14 ENCOUNTER — Other Ambulatory Visit (INDEPENDENT_AMBULATORY_CARE_PROVIDER_SITE_OTHER): Payer: Self-pay | Admitting: Physician Assistant

## 2020-04-14 DIAGNOSIS — F3289 Other specified depressive episodes: Secondary | ICD-10-CM

## 2020-04-21 ENCOUNTER — Other Ambulatory Visit: Payer: Self-pay

## 2020-04-21 ENCOUNTER — Ambulatory Visit (INDEPENDENT_AMBULATORY_CARE_PROVIDER_SITE_OTHER): Payer: BC Managed Care – PPO | Admitting: Physician Assistant

## 2020-04-21 ENCOUNTER — Encounter (INDEPENDENT_AMBULATORY_CARE_PROVIDER_SITE_OTHER): Payer: Self-pay | Admitting: Physician Assistant

## 2020-04-21 VITALS — BP 135/87 | HR 65 | Temp 97.9°F | Ht 69.0 in | Wt 283.0 lb

## 2020-04-21 DIAGNOSIS — E038 Other specified hypothyroidism: Secondary | ICD-10-CM

## 2020-04-21 DIAGNOSIS — R0602 Shortness of breath: Secondary | ICD-10-CM | POA: Diagnosis not present

## 2020-04-21 DIAGNOSIS — Z9189 Other specified personal risk factors, not elsewhere classified: Secondary | ICD-10-CM | POA: Diagnosis not present

## 2020-04-21 DIAGNOSIS — F3289 Other specified depressive episodes: Secondary | ICD-10-CM | POA: Diagnosis not present

## 2020-04-21 DIAGNOSIS — Z6841 Body Mass Index (BMI) 40.0 and over, adult: Secondary | ICD-10-CM

## 2020-04-21 MED ORDER — LEVOTHYROXINE SODIUM 175 MCG PO TABS: 175.0000 ug | ORAL_TABLET | Freq: Every day | ORAL | 0 refills | Status: DC

## 2020-04-21 MED ORDER — BUPROPION HCL ER (SR) 200 MG PO TB12: 200.0000 mg | ORAL_TABLET | Freq: Every day | ORAL | 0 refills | Status: DC

## 2020-04-22 NOTE — Progress Notes (Signed)
Chief Complaint:   OBESITY Christy [name] is here to discuss her progress with her obesity treatment plan along with follow-up of her obesity related diagnoses. Christy [name] is on the Category 2 Plan and states she is following her eating plan approximately 60% of the time. Christy [name] states she is exercising 0 minutes 0 times per week.  Today's visit was #: 24 Starting weight: 273 lbs Starting date: 12/21/2017 Today's weight: 283 lbs Today's date: 04/21/2020 Total lbs lost to date: 0 Total lbs lost since last in-office visit: 2  Interim History: Christy [name] notes skipping breakfast 1-2 times a week. She states that she snacks often after dinner and is looking for ways to control that and other snack choices.  Subjective:   SOB (shortness of breath) on exertion. Christy [name] notes increasing shortness of breath with exertion and seems to be worsening over time with weight gain. She notes getting out of breath sooner with activity than she used to. No dizziness or lightheadedness.  Other depression, with emotional eating. Christy [name] is struggling with emotional eating and using food for comfort to the extent that it is negatively impacting her health. She has been working on behavior modification techniques to help reduce her emotional eating and has been somewhat successful. She shows no sign of suicidal or homicidal ideations. No cravings. Christy [name] is on bupropion. Blood pressure is normal.  Other specified hypothyroidism. Christy [name] is on levothyroxine. No excessive fatigue or hair loss. Last TSH was not at goal.   Lab Results  Component Value Date   TSH 10.400 (H) 03/25/2020   At risk for activity intolerance. Christy [name] is at risk of exercise intolerance due to obesity.  Assessment/Plan:   SOB (shortness of breath) on exertion. Terrianne's shortness of breath appears to be obesity related and exercise induced. She has agreed to work on weight loss and gradually increase exercise to treat her exercise induced shortness of  breath. Will continue to monitor closely. IC checked today.  Other depression, with emotional eating. Behavior modification techniques were discussed today to help Christy [name] deal with her emotional/non-hunger eating behaviors.  Orders and follow up as documented in patient record. Refill was given for buPROPion (WELLBUTRIN SR) 200 MG 12 hr tablet #30 with 0 refills.  Other specified hypothyroidism. Patient with long-standing hypothyroidism, on levothyroxine therapy. She appears euthyroid. Orders and follow up as documented in patient record. Will increase levothyroxine (SYNTHROID) to 175 MCG tablet #60 with 0 refills and recheck level in 6 weeks.  Counseling . Good thyroid control is important for overall health. Supratherapeutic thyroid levels are dangerous and will not improve weight loss results. . The correct way to take levothyroxine is fasting, with water, separated by at least 30 minutes from breakfast, and separated by more than 4 hours from calcium, iron, multivitamins, acid reflux medications (PPIs).    At risk for activity intolerance. Christy [name] was given approximately 15 minutes of exercise intolerance counseling today. She is 54 y.o. female and has risk factors exercise intolerance including obesity. We discussed intensive lifestyle modifications today with an emphasis on specific weight loss instructions and strategies. Ayame will slowly increase activity as tolerated.  Repetitive spaced learning was employed today to elicit superior memory formation and behavioral change.  Class 3 severe obesity with serious comorbidity and body mass index (BMI) of 40.0 to 44.9 in adult, unspecified obesity type ([facility]).  Christy [name] is currently in the action stage of change. As such, her goal is to continue with weight loss efforts. She has agreed to  keeping a food journal and adhering to recommended goals of 1100 calories and 80 grams of protein.   Exercise goals: For substantial health benefits, adults should do  at least 150 minutes (2 hours and 30 minutes) a week of moderate-intensity, or 75 minutes (1 hour and 15 minutes) a week of vigorous-intensity aerobic physical activity, or an equivalent combination of moderate- and vigorous-intensity aerobic activity. Aerobic activity should be performed in episodes of at least 10 minutes, and preferably, it should be spread throughout the week.  Behavioral modification strategies: no skipping meals and meal planning and cooking strategies.  Christy [name] has agreed to follow-up with our clinic in 3 weeks. She was informed of the importance of frequent follow-up visits to maximize her success with intensive lifestyle modifications for her multiple health conditions.   Objective:   Blood pressure 135/87, pulse 65, temperature 97.9 F (36.6 C), temperature source Oral, height 5\' 9"  (1.753 m), weight 283 lb (128.4 kg), SpO2 97 %. Body mass index is 41.79 kg/m.  General: Cooperative, alert, well developed, in no acute distress. HEENT: Conjunctivae and lids unremarkable. Cardiovascular: Regular rhythm.  Lungs: Normal work of breathing. Neurologic: No focal deficits.   Lab Results  Component Value Date   CREATININE 0.85 03/25/2020   BUN 17 03/25/2020   NA 140 03/25/2020   K 4.3 03/25/2020   CL 104 03/25/2020   CO2 20 03/25/2020   Lab Results  Component Value Date   ALT 13 03/25/2020   AST 15 03/25/2020   ALKPHOS 80 03/25/2020   BILITOT 0.4 03/25/2020   Lab Results  Component Value Date   HGBA1C 5.5 03/25/2020   HGBA1C 5.5 06/28/2019   HGBA1C 5.5 09/04/2018   HGBA1C 5.8 (H) 04/03/2018   HGBA1C 5.7 (H) 12/21/2017   Lab Results  Component Value Date   INSULIN 16.7 03/25/2020   INSULIN 9.3 06/28/2019   INSULIN 5.7 09/04/2018   INSULIN 10.1 04/03/2018   INSULIN 14.4 12/21/2017   Lab Results  Component Value Date   TSH 10.400 (H) 03/25/2020   Lab Results  Component Value Date   CHOL 216 (H) 03/25/2020   HDL 43 03/25/2020   LDLCALC 146 (H)  03/25/2020   TRIG 150 (H) 03/25/2020   Lab Results  Component Value Date   WBC 6.5 12/21/2017   HGB 12.9 12/21/2017   HCT 39.8 12/21/2017   MCV 90 12/21/2017   No results found for: IRON, TIBC, FERRITIN  Attestation Statements:   Reviewed by clinician on day of visit: allergies, medications, problem list, medical history, surgical history, family history, social history, and previous encounter notes.  IMichaelene Song, am acting as transcriptionist for Abby Potash, PA-C   I have reviewed the above documentation for accuracy and completeness, and I agree with the above. Abby Potash, PA-C

## 2020-05-11 ENCOUNTER — Other Ambulatory Visit (INDEPENDENT_AMBULATORY_CARE_PROVIDER_SITE_OTHER): Payer: Self-pay | Admitting: Physician Assistant

## 2020-05-11 DIAGNOSIS — F3289 Other specified depressive episodes: Secondary | ICD-10-CM

## 2020-05-13 ENCOUNTER — Other Ambulatory Visit: Payer: Self-pay

## 2020-05-13 ENCOUNTER — Ambulatory Visit (INDEPENDENT_AMBULATORY_CARE_PROVIDER_SITE_OTHER): Payer: BC Managed Care – PPO | Admitting: Physician Assistant

## 2020-05-13 ENCOUNTER — Encounter (INDEPENDENT_AMBULATORY_CARE_PROVIDER_SITE_OTHER): Payer: Self-pay | Admitting: Physician Assistant

## 2020-05-13 VITALS — BP 125/85 | HR 76 | Temp 97.8°F | Ht 69.0 in | Wt 278.0 lb

## 2020-05-13 DIAGNOSIS — Z9189 Other specified personal risk factors, not elsewhere classified: Secondary | ICD-10-CM | POA: Diagnosis not present

## 2020-05-13 DIAGNOSIS — E7849 Other hyperlipidemia: Secondary | ICD-10-CM

## 2020-05-13 DIAGNOSIS — F3289 Other specified depressive episodes: Secondary | ICD-10-CM | POA: Diagnosis not present

## 2020-05-13 DIAGNOSIS — Z6841 Body Mass Index (BMI) 40.0 and over, adult: Secondary | ICD-10-CM

## 2020-05-13 MED ORDER — BUPROPION HCL ER (SR) 200 MG PO TB12: 200.0000 mg | ORAL_TABLET | Freq: Every day | ORAL | 0 refills | Status: AC

## 2020-05-13 NOTE — Progress Notes (Signed)
Chief Complaint:   OBESITY Christy [name] is here to discuss her progress with her obesity treatment plan along with follow-up of her obesity related diagnoses. Christy [name] is on keeping a food journal and adhering to recommended goals of 1100 calories and 80 grams of protein and states she is following her eating plan approximately 70-75% of the time. Christy [name] states she is exercising for 0 minutes 0 times per week.  Today's visit was #: 25 Starting weight: 273 lbs Starting date: 12/21/2017 Today's weight: 278 lbs Today's date: 05/13/2020 Total lbs lost to date: 0 Total lbs lost since last in-office visit: 5 lbs  Interim History: Christy [name] reports that she has been journaling more often.  She started taking her metformin at dinner and finds she is not snacking as much.  Subjective:   1. Other hyperlipidemia Rolanda has hyperlipidemia and has been trying to improve her cholesterol levels with intensive lifestyle modification including a low saturated fat diet, exercise and weight loss. She denies any chest pain, claudication or myalgias.  She is taking atorvastatin.  Lab Results  Component Value Date   ALT 13 03/25/2020   AST 15 03/25/2020   ALKPHOS 80 03/25/2020   BILITOT 0.4 03/25/2020   Lab Results  Component Value Date   CHOL 216 (H) 03/25/2020   HDL 43 03/25/2020   LDLCALC 146 (H) 03/25/2020   TRIG 150 (H) 03/25/2020   2. Other depression, with emotional eating Christy [name] is struggling with emotional eating and using food for comfort to the extent that it is negatively impacting her health. She has been working on behavior modification techniques to help reduce her emotional eating and has been minimally successful. She shows no sign of suicidal or homicidal ideations.  She is taking bupropion.    3. At risk for heart disease Janelys is at a higher than average risk for cardiovascular disease due to obesity.   Assessment/Plan:   1. Other hyperlipidemia Cardiovascular risk and specific lipid/LDL  goals reviewed.  We discussed several lifestyle modifications today and Ida will continue to work on diet, exercise and weight loss efforts. Orders and follow up as documented in patient record.   Counseling Intensive lifestyle modifications are the first line treatment for this issue. . Dietary changes: Increase soluble fiber. Decrease simple carbohydrates. . Exercise changes: Moderate to vigorous-intensity aerobic activity 150 minutes per week if tolerated. . Lipid-lowering medications: see documented in medical record.  2. Other depression, with emotional eating Behavior modification techniques were discussed today to help Christy [name] deal with her emotional/non-hunger eating behaviors.  Orders and follow up as documented in patient record.  - buPROPion (WELLBUTRIN SR) 200 MG 12 hr tablet; Take 1 tablet (200 mg total) by mouth daily with breakfast.  Dispense: 30 tablet; Refill: 0  3. At risk for heart disease Christy [name] was given approximately 15 minutes of coronary artery disease prevention counseling today. She is 54 y.o. female and has risk factors for heart disease including obesity. We discussed intensive lifestyle modifications today with an emphasis on specific weight loss instructions and strategies.   Repetitive spaced learning was employed today to elicit superior memory formation and behavioral change.  4. Class 3 severe obesity with serious comorbidity and body mass index (BMI) of 40.0 to 44.9 in adult, unspecified obesity type [name]) Christy [name] is currently in the action stage of change. As such, her goal is to continue with weight loss efforts. She has agreed to keeping a food journal and adhering to recommended goals of 1100-1200 calories  and 80 grams of protein.   Exercise goals: For substantial health benefits, adults should do at least 150 minutes (2 hours and 30 minutes) a week of moderate-intensity, or 75 minutes (1 hour and 15 minutes) a week of vigorous-intensity aerobic physical  activity, or an equivalent combination of moderate- and vigorous-intensity aerobic activity. Aerobic activity should be performed in episodes of at least 10 minutes, and preferably, it should be spread throughout the week.  Behavioral modification strategies: meal planning and cooking strategies and planning for success.  Christy [name] has agreed to follow-up with our clinic in 3 weeks. She was informed of the importance of frequent follow-up visits to maximize her success with intensive lifestyle modifications for her multiple health conditions.   Objective:   Blood pressure 125/85, pulse 76, temperature 97.8 F (36.6 C), temperature source Oral, height 5\' 9"  (1.753 m), weight 278 lb (126.1 kg), SpO2 97 %. Body mass index is 41.05 kg/m.  General: Cooperative, alert, well developed, in no acute distress. HEENT: Conjunctivae and lids unremarkable. Cardiovascular: Regular rhythm.  Lungs: Normal work of breathing. Neurologic: No focal deficits.   Lab Results  Component Value Date   CREATININE 0.85 03/25/2020   BUN 17 03/25/2020   NA 140 03/25/2020   K 4.3 03/25/2020   CL 104 03/25/2020   CO2 20 03/25/2020   Lab Results  Component Value Date   ALT 13 03/25/2020   AST 15 03/25/2020   ALKPHOS 80 03/25/2020   BILITOT 0.4 03/25/2020   Lab Results  Component Value Date   HGBA1C 5.5 03/25/2020   HGBA1C 5.5 06/28/2019   HGBA1C 5.5 09/04/2018   HGBA1C 5.8 (H) 04/03/2018   HGBA1C 5.7 (H) 12/21/2017   Lab Results  Component Value Date   INSULIN 16.7 03/25/2020   INSULIN 9.3 06/28/2019   INSULIN 5.7 09/04/2018   INSULIN 10.1 04/03/2018   INSULIN 14.4 12/21/2017   Lab Results  Component Value Date   TSH 10.400 (H) 03/25/2020   Lab Results  Component Value Date   CHOL 216 (H) 03/25/2020   HDL 43 03/25/2020   LDLCALC 146 (H) 03/25/2020   TRIG 150 (H) 03/25/2020   Lab Results  Component Value Date   WBC 6.5 12/21/2017   HGB 12.9 12/21/2017   HCT 39.8 12/21/2017   MCV 90  12/21/2017   Attestation Statements:   Reviewed by clinician on day of visit: allergies, medications, problem list, medical history, surgical history, family history, social history, and previous encounter notes.  I, 02/18/2018, CMA, am acting as Insurance claims handler for Energy manager, PA-C.  I have reviewed the above documentation for accuracy and completeness, and I agree with the above. [employer], PA-C

## 2020-05-30 ENCOUNTER — Other Ambulatory Visit (INDEPENDENT_AMBULATORY_CARE_PROVIDER_SITE_OTHER): Payer: Self-pay | Admitting: Family Medicine

## 2020-05-30 DIAGNOSIS — E038 Other specified hypothyroidism: Secondary | ICD-10-CM

## 2020-06-08 ENCOUNTER — Other Ambulatory Visit (INDEPENDENT_AMBULATORY_CARE_PROVIDER_SITE_OTHER): Payer: Self-pay | Admitting: Physician Assistant

## 2020-06-08 DIAGNOSIS — F3289 Other specified depressive episodes: Secondary | ICD-10-CM

## 2020-06-09 ENCOUNTER — Encounter (INDEPENDENT_AMBULATORY_CARE_PROVIDER_SITE_OTHER): Payer: Self-pay | Admitting: Physician Assistant

## 2020-06-09 ENCOUNTER — Ambulatory Visit (INDEPENDENT_AMBULATORY_CARE_PROVIDER_SITE_OTHER): Payer: BC Managed Care – PPO | Admitting: Physician Assistant

## 2020-06-09 ENCOUNTER — Other Ambulatory Visit: Payer: Self-pay

## 2020-06-09 VITALS — BP 104/74 | HR 77 | Temp 97.9°F | Ht 69.0 in | Wt 275.0 lb

## 2020-06-09 DIAGNOSIS — Z9189 Other specified personal risk factors, not elsewhere classified: Secondary | ICD-10-CM

## 2020-06-09 DIAGNOSIS — E038 Other specified hypothyroidism: Secondary | ICD-10-CM | POA: Diagnosis not present

## 2020-06-09 DIAGNOSIS — E559 Vitamin D deficiency, unspecified: Secondary | ICD-10-CM

## 2020-06-09 DIAGNOSIS — Z6841 Body Mass Index (BMI) 40.0 and over, adult: Secondary | ICD-10-CM

## 2020-06-09 MED ORDER — LEVOTHYROXINE SODIUM 175 MCG PO TABS: 175.0000 ug | ORAL_TABLET | Freq: Every day | ORAL | 0 refills | Status: DC

## 2020-06-09 NOTE — Progress Notes (Signed)
Chief Complaint:   OBESITY Christy [name] is here to discuss her progress with her obesity treatment plan along with follow-up of her obesity related diagnoses. Christy [name] is keeping a food journal and adhering to recommended goals of 1300 calories and 80 grams of protein and states she is following her eating plan approximately 75% of the time. Christy [name] states she is doing exercise videos 45 minutes 4 times per week.  Today's visit was #: 26 Starting weight: 273 lbs Starting date: 12/21/2017 Today's weight: 275 lbs Today's date: 06/09/2020 Total lbs lost to date: 0 Total lbs lost since last in-office visit: 3  Interim History: Christy [name] does well with journaling breakfast and lunch, but sometimes forgets to journal dinner.  Subjective:   Other specified hypothyroidism. No heat or cold intolerance. Christy [name] is on levothyroxine.  Lab Results  Component Value Date   TSH 10.400 (H) 03/25/2020   Vitamin D deficiency. Christy [name] is on Vitamin D supplementation. No nausea, vomiting, or muscle weakness. Last Vitamin D was 27.6 on 03/25/2020.  At risk for osteoporosis. Christy [name] is at higher risk of osteopenia and osteoporosis due to Vitamin D deficiency.   Assessment/Plan:   Other specified hypothyroidism. Patient with long-standing hypothyroidism, on levothyroxine therapy. She appears euthyroid. Orders and follow up as documented in patient record. Refill was given for levothyroxine (SYNTHROID) 175 MCG tablet #30 with 0 refills. T3, T4, free, TSH levels ordered today.  Counseling  Good thyroid control is important for overall health. Supratherapeutic thyroid levels are dangerous and will not improve weight loss results.  The correct way to take levothyroxine is fasting, with water, separated by at least 30 minutes from breakfast, and separated by more than 4 hours from calcium, iron, multivitamins, acid reflux medications (PPIs).    Vitamin D deficiency. Low Vitamin D level contributes to fatigue and  are associated with obesity, breast, and colon cancer. She agrees to continue to take Vitamin D and will follow-up for routine testing of Vitamin D, at least 2-3 times per year to avoid over-replacement.  At risk for osteoporosis. Christy [name] was given approximately 15 minutes of osteoporosis prevention counseling today. Christy [name] is at risk for osteopenia and osteoporosis due to her Vitamin D deficiency. She was encouraged to take her Vitamin D and follow her higher calcium diet and increase strengthening exercise to help strengthen her bones and decrease her risk of osteopenia and osteoporosis.  Repetitive spaced learning was employed today to elicit superior memory formation and behavioral change.  Class 3 severe obesity with serious comorbidity and body mass index (BMI) of 40.0 to 44.9 in adult, unspecified obesity type (HCC).  Christy [name] is currently in the action stage of change. As such, her goal is to continue with weight loss efforts. She has agreed to keeping a food journal and adhering to recommended goals of 1300 calories and 80 grams of protein.   Exercise goals: For substantial health benefits, adults should do at least 150 minutes (2 hours and 30 minutes) a week of moderate-intensity, or 75 minutes (1 hour and 15 minutes) a week of vigorous-intensity aerobic physical activity, or an equivalent combination of moderate- and vigorous-intensity aerobic activity. Aerobic activity should be performed in episodes of at least 10 minutes, and preferably, it should be spread throughout the week.  Behavioral modification strategies: meal planning and cooking strategies and keeping a strict food journal.  Christy [name] has agreed to follow-up with our clinic in 4 weeks. She was informed of the importance of frequent follow-up visits to maximize  her success with intensive lifestyle modifications for her multiple health conditions.   Christy [name] was informed we would discuss her lab results at her next visit unless there is a  critical issue that needs to be addressed sooner. Christy [name] agreed to keep her next visit at the agreed upon time to discuss these results.  Objective:   Blood pressure 104/74, pulse 77, temperature 97.9 F (36.6 C), temperature source Oral, height 5\' 9"  (1.753 m), weight 275 lb (124.7 kg), SpO2 98 %. Body mass index is 40.61 kg/m.  General: Cooperative, alert, well developed, in no acute distress. HEENT: Conjunctivae and lids unremarkable. Cardiovascular: Regular rhythm.  Lungs: Normal work of breathing. Neurologic: No focal deficits.   Lab Results  Component Value Date   CREATININE 0.85 03/25/2020   BUN 17 03/25/2020   NA 140 03/25/2020   K 4.3 03/25/2020   CL 104 03/25/2020   CO2 20 03/25/2020   Lab Results  Component Value Date   ALT 13 03/25/2020   AST 15 03/25/2020   ALKPHOS 80 03/25/2020   BILITOT 0.4 03/25/2020   Lab Results  Component Value Date   HGBA1C 5.5 03/25/2020   HGBA1C 5.5 06/28/2019   HGBA1C 5.5 09/04/2018   HGBA1C 5.8 (H) 04/03/2018   HGBA1C 5.7 (H) 12/21/2017   Lab Results  Component Value Date   INSULIN 16.7 03/25/2020   INSULIN 9.3 06/28/2019   INSULIN 5.7 09/04/2018   INSULIN 10.1 04/03/2018   INSULIN 14.4 12/21/2017   Lab Results  Component Value Date   TSH 10.400 (H) 03/25/2020   Lab Results  Component Value Date   CHOL 216 (H) 03/25/2020   HDL 43 03/25/2020   LDLCALC 146 (H) 03/25/2020   TRIG 150 (H) 03/25/2020   Lab Results  Component Value Date   WBC 6.5 12/21/2017   HGB 12.9 12/21/2017   HCT 39.8 12/21/2017   MCV 90 12/21/2017   No results found for: IRON, TIBC, FERRITIN  Attestation Statements:   Reviewed by clinician on day of visit: allergies, medications, problem list, medical history, surgical history, family history, social history, and previous encounter notes.  IMichaelene Song, am acting as transcriptionist for Abby Potash, PA-C   I have reviewed the above documentation for accuracy and completeness, and I  agree with the above. Abby Potash, PA-C

## 2020-06-10 LAB — TSH: TSH: 0.253 u[IU]/mL — ABNORMAL LOW (ref 0.450–4.500)

## 2020-06-10 LAB — T4, FREE: Free T4: 2.06 ng/dL — ABNORMAL HIGH (ref 0.82–1.77)

## 2020-06-10 LAB — T3: T3, Total: 96 ng/dL (ref 71–180)

## 2020-07-01 ENCOUNTER — Other Ambulatory Visit (INDEPENDENT_AMBULATORY_CARE_PROVIDER_SITE_OTHER): Payer: Self-pay | Admitting: Physician Assistant

## 2020-07-01 ENCOUNTER — Encounter (INDEPENDENT_AMBULATORY_CARE_PROVIDER_SITE_OTHER): Payer: Self-pay

## 2020-07-01 DIAGNOSIS — E038 Other specified hypothyroidism: Secondary | ICD-10-CM

## 2020-07-07 ENCOUNTER — Ambulatory Visit (INDEPENDENT_AMBULATORY_CARE_PROVIDER_SITE_OTHER): Payer: BC Managed Care – PPO | Admitting: Physician Assistant

## 2020-07-20 ENCOUNTER — Other Ambulatory Visit (INDEPENDENT_AMBULATORY_CARE_PROVIDER_SITE_OTHER): Payer: Self-pay | Admitting: Physician Assistant

## 2020-07-20 DIAGNOSIS — E038 Other specified hypothyroidism: Secondary | ICD-10-CM

## 2020-07-24 ENCOUNTER — Other Ambulatory Visit (INDEPENDENT_AMBULATORY_CARE_PROVIDER_SITE_OTHER): Payer: Self-pay | Admitting: Physician Assistant

## 2020-07-24 ENCOUNTER — Other Ambulatory Visit (INDEPENDENT_AMBULATORY_CARE_PROVIDER_SITE_OTHER): Payer: Self-pay | Admitting: Family Medicine

## 2020-07-24 DIAGNOSIS — E559 Vitamin D deficiency, unspecified: Secondary | ICD-10-CM

## 2020-07-24 DIAGNOSIS — E038 Other specified hypothyroidism: Secondary | ICD-10-CM

## 2020-07-24 NOTE — Telephone Encounter (Signed)
Please advise if you want to send in a few to get her to her appt?  She stated that she is completely out of this medication.  Thanks

## 2020-07-28 ENCOUNTER — Encounter (INDEPENDENT_AMBULATORY_CARE_PROVIDER_SITE_OTHER): Payer: Self-pay | Admitting: Physician Assistant

## 2020-07-28 ENCOUNTER — Other Ambulatory Visit: Payer: Self-pay

## 2020-07-28 ENCOUNTER — Ambulatory Visit (INDEPENDENT_AMBULATORY_CARE_PROVIDER_SITE_OTHER): Payer: BC Managed Care – PPO | Admitting: Physician Assistant

## 2020-07-28 VITALS — BP 116/80 | HR 73 | Temp 98.2°F | Ht 69.0 in | Wt 276.0 lb

## 2020-07-28 DIAGNOSIS — E559 Vitamin D deficiency, unspecified: Secondary | ICD-10-CM | POA: Diagnosis not present

## 2020-07-28 DIAGNOSIS — Z9189 Other specified personal risk factors, not elsewhere classified: Secondary | ICD-10-CM | POA: Diagnosis not present

## 2020-07-28 DIAGNOSIS — Z6841 Body Mass Index (BMI) 40.0 and over, adult: Secondary | ICD-10-CM

## 2020-07-28 DIAGNOSIS — E038 Other specified hypothyroidism: Secondary | ICD-10-CM | POA: Diagnosis not present

## 2020-07-28 DIAGNOSIS — E7849 Other hyperlipidemia: Secondary | ICD-10-CM | POA: Diagnosis not present

## 2020-07-28 MED ORDER — LEVOTHYROXINE SODIUM 175 MCG PO TABS: 175.0000 ug | ORAL_TABLET | Freq: Every day | ORAL | 0 refills | Status: DC

## 2020-07-28 MED ORDER — VITAMIN D (ERGOCALCIFEROL) 1.25 MG (50000 UNIT) PO CAPS: 50000.0000 [IU] | ORAL_CAPSULE | ORAL | 0 refills | Status: AC

## 2020-07-28 MED ORDER — LEVOTHYROXINE SODIUM 150 MCG PO TABS: 150.0000 ug | ORAL_TABLET | Freq: Every day | ORAL | 0 refills | Status: DC

## 2020-07-28 MED ORDER — ATORVASTATIN CALCIUM 10 MG PO TABS: 10.0000 mg | ORAL_TABLET | Freq: Every day | ORAL | 0 refills | Status: AC

## 2020-07-28 NOTE — Progress Notes (Signed)
Chief Complaint:   OBESITY Christy [name] is here to discuss her progress with her obesity treatment plan along with follow-up of her obesity related diagnoses. Christy [name] is keeping a food journal and adhering to recommended goals of 1300 calories and 80 grams of protein and states she is following her eating plan approximately 40% of the time. Christy [name] states she is exercising 0 minutes 0 times per week.  Today's visit was #: 27 Starting weight: 273 lbs Starting date: 12/21/2017 Today's weight: 276 lbs Today's date: 07/28/2020 Total lbs lost to date: 0 Total lbs lost since last in-office visit: 0  Interim History: Christy [name] states that she has been off plan the last 2 weeks when her father was hospitalized. She is frustrated with herself and lack of weight loss.  Subjective:   Other specified hypothyroidism. Christy [name] is on levothyroxine. Last labs were out of range.  Lab Results  Component Value Date   TSH 0.253 (L) 06/09/2020   Vitamin D deficiency. Christy [name] is on prescription Vitamin D supplementation.    Ref. Range 03/25/2020 10:20  Vitamin D, 25-Hydroxy Latest Ref Range: 30.0 - 100.0 ng/mL 27.6 (L)   Other hyperlipidemia. Christy [name] is on atorvastatin. No chest pain or myalgias.    Lab Results  Component Value Date   CHOL 216 (H) 03/25/2020   HDL 43 03/25/2020   LDLCALC 146 (H) 03/25/2020   TRIG 150 (H) 03/25/2020   Lab Results  Component Value Date   ALT 13 03/25/2020   AST 15 03/25/2020   ALKPHOS 80 03/25/2020   BILITOT 0.4 03/25/2020   The 10-year ASCVD risk score Denman George DC Jr., et al., 2013) is: 2.3%   Values used to calculate the score:     Age: 48 years     Sex: Female     Is Non-Hispanic African American: No     Diabetic: No     Tobacco smoker: No     Systolic Blood Pressure: 116 mmHg     Is BP treated: No     HDL Cholesterol: 43 mg/dL     Total Cholesterol: 216 mg/dL  At risk for heart disease.Christy [name] is at a higher than average risk for cardiovascular disease due to  obesity.   Assessment/Plan:   Other specified hypothyroidism. Patient with long-standing hypothyroidism, on levothyroxine therapy. She appears euthyroid. Orders and follow up as documented in patient record. Christy [name] will take levothyroxine (SYNTHROID) 150 MCG tablet on Monday, Wednesday, and Friday #30 and levothyroxine (SYNTHROID) 175 MCG tablet on Tuesday, Thursday, Saturday, and Sunday #30. Will recheck labs in 6 weeks.  Counseling  Good thyroid control is important for overall health. Supratherapeutic thyroid levels are dangerous and will not improve weight loss results.  The correct way to take levothyroxine is fasting, with water, separated by at least 30 minutes from breakfast, and separated by more than 4 hours from calcium, iron, multivitamins, acid reflux medications (PPIs).   Vitamin D deficiency. Low Vitamin D level contributes to fatigue and are associated with obesity, breast, and colon cancer. She was given a refill on her Vitamin D, Ergocalciferol, (DRISDOL) 1.25 MG (50000 UNIT) CAPS capsule every week #4 with 0 refills and will follow-up for routine testing of Vitamin D, at least 2-3 times per year to avoid over-replacement.   Other hyperlipidemia. Cardiovascular risk and specific lipid/LDL goals reviewed.  We discussed several lifestyle modifications today and Christy [name] will continue to work on diet, exercise and weight loss efforts. Orders and follow up as documented in patient  record. Refill was given for atorvastatin (LIPITOR) 10 MG tablet #30 with 0 refills.  Counseling Intensive lifestyle modifications are the first line treatment for this issue.  Dietary changes: Increase soluble fiber. Decrease simple carbohydrates.  Exercise changes: Moderate to vigorous-intensity aerobic activity 150 minutes per week if tolerated.  Lipid-lowering medications: see documented in medical record.   At risk for heart disease. Christy [name] was given approximately 15 minutes of coronary artery disease  prevention counseling today. She is 54 y.o. female and has risk factors for heart disease including obesity. We discussed intensive lifestyle modifications today with an emphasis on specific weight loss instructions and strategies.   Repetitive spaced learning was employed today to elicit superior memory formation and behavioral change.  Class 3 severe obesity with serious comorbidity and body mass index (BMI) of 40.0 to 44.9 in adult, unspecified obesity type (HCC).  Christy [name] is currently in the action stage of change. As such, her goal is to continue with weight loss efforts. She has agreed to the Category 2 Plan + 100 calories.   Exercise goals: For substantial health benefits, adults should do at least 150 minutes (2 hours and 30 minutes) a week of moderate-intensity, or 75 minutes (1 hour and 15 minutes) a week of vigorous-intensity aerobic physical activity, or an equivalent combination of moderate- and vigorous-intensity aerobic activity. Aerobic activity should be performed in episodes of at least 10 minutes, and preferably, it should be spread throughout the week.  Behavioral modification strategies: meal planning and cooking strategies and planning for success.  Christy [name] has agreed to follow-up with our clinic in 3 weeks. She was informed of the importance of frequent follow-up visits to maximize her success with intensive lifestyle modifications for her multiple health conditions.   Objective:   Blood pressure 116/80, pulse 73, temperature 98.2 F (36.8 C), temperature source Oral, height 5\' 9"  (1.753 m), weight 276 lb (125.2 kg), SpO2 97 %. Body mass index is 40.76 kg/m.  General: Cooperative, alert, well developed, in no acute distress. HEENT: Conjunctivae and lids unremarkable. Cardiovascular: Regular rhythm.  Lungs: Normal work of breathing. Neurologic: No focal deficits.   Lab Results  Component Value Date   CREATININE 0.85 03/25/2020   BUN 17 03/25/2020   NA 140 03/25/2020     K 4.3 03/25/2020   CL 104 03/25/2020   CO2 20 03/25/2020   Lab Results  Component Value Date   ALT 13 03/25/2020   AST 15 03/25/2020   ALKPHOS 80 03/25/2020   BILITOT 0.4 03/25/2020   Lab Results  Component Value Date   HGBA1C 5.5 03/25/2020   HGBA1C 5.5 06/28/2019   HGBA1C 5.5 09/04/2018   HGBA1C 5.8 (H) 04/03/2018   HGBA1C 5.7 (H) 12/21/2017   Lab Results  Component Value Date   INSULIN 16.7 03/25/2020   INSULIN 9.3 06/28/2019   INSULIN 5.7 09/04/2018   INSULIN 10.1 04/03/2018   INSULIN 14.4 12/21/2017   Lab Results  Component Value Date   TSH 0.253 (L) 06/09/2020   Lab Results  Component Value Date   CHOL 216 (H) 03/25/2020   HDL 43 03/25/2020   LDLCALC 146 (H) 03/25/2020   TRIG 150 (H) 03/25/2020   Lab Results  Component Value Date   WBC 6.5 12/21/2017   HGB 12.9 12/21/2017   HCT 39.8 12/21/2017   MCV 90 12/21/2017   No results found for: IRON, TIBC, FERRITIN  Attestation Statements:   Reviewed by clinician on day of visit: allergies, medications, problem list, medical history, surgical  history, family history, social history, and previous encounter notes.  IMarianna Payment, am acting as transcriptionist for Alois Cliche, PA-C   I have reviewed the above documentation for accuracy and completeness, and I agree with the above. Alois Cliche, PA-C

## 2020-07-29 NOTE — Telephone Encounter (Signed)
Saw her yesterday so this is [facility]. Thanks

## 2020-08-13 ENCOUNTER — Other Ambulatory Visit (INDEPENDENT_AMBULATORY_CARE_PROVIDER_SITE_OTHER): Payer: Self-pay | Admitting: Physician Assistant

## 2020-08-13 DIAGNOSIS — E038 Other specified hypothyroidism: Secondary | ICD-10-CM

## 2020-08-13 MED ORDER — LEVOTHYROXINE SODIUM 175 MCG PO TABS: 175.0000 ug | ORAL_TABLET | Freq: Every day | ORAL | 0 refills | Status: AC

## 2020-08-13 MED ORDER — LEVOTHYROXINE SODIUM 150 MCG PO TABS: 150.0000 ug | ORAL_TABLET | Freq: Every day | ORAL | 0 refills | Status: AC

## 2020-08-17 ENCOUNTER — Other Ambulatory Visit (INDEPENDENT_AMBULATORY_CARE_PROVIDER_SITE_OTHER): Payer: Self-pay | Admitting: Physician Assistant

## 2020-08-17 DIAGNOSIS — E559 Vitamin D deficiency, unspecified: Secondary | ICD-10-CM

## 2020-08-19 ENCOUNTER — Other Ambulatory Visit (INDEPENDENT_AMBULATORY_CARE_PROVIDER_SITE_OTHER): Payer: Self-pay | Admitting: Physician Assistant

## 2020-08-19 DIAGNOSIS — E7849 Other hyperlipidemia: Secondary | ICD-10-CM

## 2020-08-20 ENCOUNTER — Ambulatory Visit (INDEPENDENT_AMBULATORY_CARE_PROVIDER_SITE_OTHER): Payer: BC Managed Care – PPO | Admitting: Physician Assistant

## 2020-09-04 ENCOUNTER — Encounter (INDEPENDENT_AMBULATORY_CARE_PROVIDER_SITE_OTHER): Payer: Self-pay

## 2020-09-04 ENCOUNTER — Other Ambulatory Visit (INDEPENDENT_AMBULATORY_CARE_PROVIDER_SITE_OTHER): Payer: Self-pay | Admitting: Physician Assistant

## 2020-09-04 DIAGNOSIS — E038 Other specified hypothyroidism: Secondary | ICD-10-CM

## 2020-09-28 ENCOUNTER — Other Ambulatory Visit (INDEPENDENT_AMBULATORY_CARE_PROVIDER_SITE_OTHER): Payer: Self-pay | Admitting: Physician Assistant

## 2020-09-28 DIAGNOSIS — E038 Other specified hypothyroidism: Secondary | ICD-10-CM

## 2020-09-29 ENCOUNTER — Encounter (INDEPENDENT_AMBULATORY_CARE_PROVIDER_SITE_OTHER): Payer: Self-pay

## 2022-07-28 ENCOUNTER — Encounter (INDEPENDENT_AMBULATORY_CARE_PROVIDER_SITE_OTHER): Payer: Self-pay

## 2022-08-16 ENCOUNTER — Other Ambulatory Visit (HOSPITAL_BASED_OUTPATIENT_CLINIC_OR_DEPARTMENT_OTHER): Payer: Self-pay

## 2022-08-16 MED ORDER — SAXENDA 18 MG/3ML ~~LOC~~ SOPN
3.0000 mg | PEN_INJECTOR | Freq: Every day | SUBCUTANEOUS | 2 refills | Status: AC
  Filled 2022-08-16: qty 15, 30d supply, fill #0
  Filled 2022-10-11: qty 15, 30d supply, fill #1

## 2022-08-26 ENCOUNTER — Other Ambulatory Visit (HOSPITAL_BASED_OUTPATIENT_CLINIC_OR_DEPARTMENT_OTHER): Payer: Self-pay

## 2022-08-26 MED ORDER — SAXENDA 18 MG/3ML ~~LOC~~ SOPN
3.0000 mg | PEN_INJECTOR | Freq: Every day | SUBCUTANEOUS | 2 refills | Status: AC
  Filled 2022-08-26 – 2022-09-11 (×2): qty 15, 30d supply, fill #0
  Filled 2022-11-10: qty 15, 30d supply, fill #1

## 2022-09-12 ENCOUNTER — Other Ambulatory Visit (HOSPITAL_BASED_OUTPATIENT_CLINIC_OR_DEPARTMENT_OTHER): Payer: Self-pay

## 2022-09-13 ENCOUNTER — Encounter (HOSPITAL_BASED_OUTPATIENT_CLINIC_OR_DEPARTMENT_OTHER): Payer: Self-pay | Admitting: Pharmacist

## 2022-09-13 ENCOUNTER — Encounter (HOSPITAL_BASED_OUTPATIENT_CLINIC_OR_DEPARTMENT_OTHER): Payer: Self-pay

## 2022-09-13 ENCOUNTER — Other Ambulatory Visit (HOSPITAL_BASED_OUTPATIENT_CLINIC_OR_DEPARTMENT_OTHER): Payer: Self-pay

## 2022-09-14 ENCOUNTER — Other Ambulatory Visit (HOSPITAL_BASED_OUTPATIENT_CLINIC_OR_DEPARTMENT_OTHER): Payer: Self-pay

## 2022-10-11 ENCOUNTER — Other Ambulatory Visit (HOSPITAL_BASED_OUTPATIENT_CLINIC_OR_DEPARTMENT_OTHER): Payer: Self-pay

## 2022-10-12 ENCOUNTER — Other Ambulatory Visit (HOSPITAL_BASED_OUTPATIENT_CLINIC_OR_DEPARTMENT_OTHER): Payer: Self-pay

## 2022-11-09 ENCOUNTER — Encounter (HOSPITAL_BASED_OUTPATIENT_CLINIC_OR_DEPARTMENT_OTHER): Payer: Self-pay | Admitting: Pharmacist

## 2022-11-09 ENCOUNTER — Other Ambulatory Visit (HOSPITAL_BASED_OUTPATIENT_CLINIC_OR_DEPARTMENT_OTHER): Payer: Self-pay

## 2022-11-09 MED ORDER — WEGOVY 1 MG/0.5ML ~~LOC~~ SOAJ
1.0000 mg | SUBCUTANEOUS | 0 refills | Status: AC
  Filled 2022-11-09: qty 2, 28d supply, fill #0

## 2022-11-10 ENCOUNTER — Other Ambulatory Visit (HOSPITAL_BASED_OUTPATIENT_CLINIC_OR_DEPARTMENT_OTHER): Payer: Self-pay

## 2022-11-22 ENCOUNTER — Other Ambulatory Visit (HOSPITAL_BASED_OUTPATIENT_CLINIC_OR_DEPARTMENT_OTHER): Payer: Self-pay

## 2022-11-23 ENCOUNTER — Other Ambulatory Visit (HOSPITAL_BASED_OUTPATIENT_CLINIC_OR_DEPARTMENT_OTHER): Payer: Self-pay

## 2022-12-27 ENCOUNTER — Other Ambulatory Visit (HOSPITAL_BASED_OUTPATIENT_CLINIC_OR_DEPARTMENT_OTHER): Payer: Self-pay

## 2022-12-27 MED ORDER — WEGOVY 2.4 MG/0.75ML ~~LOC~~ SOAJ
2.4000 mg | SUBCUTANEOUS | 0 refills | Status: DC
  Filled 2023-01-29 – 2023-02-05 (×2): qty 3, 28d supply, fill #0

## 2022-12-27 MED ORDER — WEGOVY 1.7 MG/0.75ML ~~LOC~~ SOAJ
1.7000 mg | SUBCUTANEOUS | 0 refills | Status: AC
  Filled 2022-12-27: qty 3, 28d supply, fill #0

## 2023-01-03 ENCOUNTER — Other Ambulatory Visit (HOSPITAL_BASED_OUTPATIENT_CLINIC_OR_DEPARTMENT_OTHER): Payer: Self-pay

## 2023-01-29 ENCOUNTER — Other Ambulatory Visit (HOSPITAL_BASED_OUTPATIENT_CLINIC_OR_DEPARTMENT_OTHER): Payer: Self-pay

## 2023-02-05 ENCOUNTER — Other Ambulatory Visit (HOSPITAL_BASED_OUTPATIENT_CLINIC_OR_DEPARTMENT_OTHER): Payer: Self-pay

## 2023-02-15 ENCOUNTER — Other Ambulatory Visit (HOSPITAL_BASED_OUTPATIENT_CLINIC_OR_DEPARTMENT_OTHER): Payer: Self-pay

## 2023-02-15 MED ORDER — WEGOVY 2.4 MG/0.75ML ~~LOC~~ SOAJ
2.4000 mg | SUBCUTANEOUS | 1 refills | Status: AC
  Filled 2023-02-15 – 2023-03-02 (×2): qty 3, 28d supply, fill #0

## 2023-03-02 ENCOUNTER — Other Ambulatory Visit (HOSPITAL_BASED_OUTPATIENT_CLINIC_OR_DEPARTMENT_OTHER): Payer: Self-pay
